# Patient Record
Sex: Female | Born: 1955 | Race: White | Hispanic: No | Marital: Married | Smoking: Never smoker
Health system: Southern US, Community
[De-identification: ages and names within clinical notes are randomized; demographics above are authoritative.]

## PROBLEM LIST (undated history)

## (undated) DIAGNOSIS — M722 Plantar fascial fibromatosis: Secondary | ICD-10-CM

## (undated) DIAGNOSIS — G5600 Carpal tunnel syndrome, unspecified upper limb: Secondary | ICD-10-CM

## (undated) DIAGNOSIS — R112 Nausea with vomiting, unspecified: Secondary | ICD-10-CM

## (undated) DIAGNOSIS — N8501 Benign endometrial hyperplasia: Secondary | ICD-10-CM

## (undated) DIAGNOSIS — Z9889 Other specified postprocedural states: Secondary | ICD-10-CM

## (undated) DIAGNOSIS — I10 Essential (primary) hypertension: Secondary | ICD-10-CM

## (undated) DIAGNOSIS — L920 Granuloma annulare: Secondary | ICD-10-CM

## (undated) DIAGNOSIS — G4733 Obstructive sleep apnea (adult) (pediatric): Secondary | ICD-10-CM

## (undated) DIAGNOSIS — N840 Polyp of corpus uteri: Secondary | ICD-10-CM

## (undated) HISTORY — DX: Essential (primary) hypertension: I10

## (undated) HISTORY — PX: DILATION AND CURETTAGE OF UTERUS: SHX78

## (undated) HISTORY — DX: Plantar fascial fibromatosis: M72.2

## (undated) HISTORY — DX: Granuloma annulare: L92.0

## (undated) HISTORY — DX: Obstructive sleep apnea (adult) (pediatric): G47.33

## (undated) HISTORY — DX: Benign endometrial hyperplasia: N85.01

## (undated) HISTORY — DX: Carpal tunnel syndrome, unspecified upper limb: G56.00

## (undated) HISTORY — PX: TONSILLECTOMY: SUR1361

## (undated) HISTORY — DX: Polyp of corpus uteri: N84.0

---

## 1987-04-28 HISTORY — PX: DILATION AND CURETTAGE, DIAGNOSTIC / THERAPEUTIC: SUR384

## 1991-04-28 HISTORY — PX: TUBAL LIGATION: SHX77

## 2010-10-26 LAB — HM COLONOSCOPY

## 2013-09-25 DIAGNOSIS — M549 Dorsalgia, unspecified: Secondary | ICD-10-CM | POA: Insufficient documentation

## 2014-10-05 ENCOUNTER — Encounter: Payer: Self-pay | Admitting: Gynecologic Oncology

## 2014-10-05 ENCOUNTER — Ambulatory Visit: Payer: BLUE CROSS/BLUE SHIELD | Attending: Gynecologic Oncology | Admitting: Gynecologic Oncology

## 2014-10-05 VITALS — BP 139/89 | Temp 98.6°F | Resp 20 | Ht 64.0 in | Wt 216.7 lb

## 2014-10-05 DIAGNOSIS — Z6837 Body mass index (BMI) 37.0-37.9, adult: Secondary | ICD-10-CM | POA: Diagnosis not present

## 2014-10-05 DIAGNOSIS — Z8041 Family history of malignant neoplasm of ovary: Secondary | ICD-10-CM | POA: Insufficient documentation

## 2014-10-05 DIAGNOSIS — E669 Obesity, unspecified: Secondary | ICD-10-CM

## 2014-10-05 DIAGNOSIS — Z7982 Long term (current) use of aspirin: Secondary | ICD-10-CM | POA: Insufficient documentation

## 2014-10-05 DIAGNOSIS — Z8049 Family history of malignant neoplasm of other genital organs: Secondary | ICD-10-CM | POA: Diagnosis not present

## 2014-10-05 DIAGNOSIS — N8501 Benign endometrial hyperplasia: Secondary | ICD-10-CM | POA: Diagnosis not present

## 2014-10-05 DIAGNOSIS — I10 Essential (primary) hypertension: Secondary | ICD-10-CM | POA: Diagnosis not present

## 2014-10-05 NOTE — Progress Notes (Signed)
New Patient Note: Gyn-Onc   Masha Orbach 59 y.o. female requested a new patient consultation regarding complex endometrial hyperplasia.  CC:  Chief Complaint  Patient presents with  . complex endometrial hyperplasia    New patient    Assessment/Plan:  Ms. Windy Dudek  is a 59 y.o.  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 She also has a family history significant for second-degree relatives with ovarian and endometrial cancer.  I had a 45 minute face-to-face counseling session with Ms. Delone during which time I reviewed her records and discussed with her the underlying etiology and nature of complex endometrial hyperplasia. We discussed that there is a 25-30% risk for occult invasive endometrial cancer that coexist with this diagnosis and can only be confirmed on hysterectomy. We also discussed that there is an approximate 30% risk of progression to invasive endometrial cancer if no treatment is instituted. I discussed options for therapy including, the recommended, hysterectomy (this would need to be a total hysterectomy with inclusion of removal of the cervix and, given her family history, I would recommend BSO), or alternatively she could consider a trial of medical therapy with progesterone's. I discussed that progesterone therapy can be delivered by her either intrauterine device, or oral progesterone. We discussed the side effects, benefits, and disadvantages of these different modes of progesterone. I also discussed that progesterone therapy is associated with an up to a 50% chance of regression of the hyperplasia, however is not curative in half of women. I also discussed that it would not provide [place] with information regarding an occult malignancy that is present. I discussed that we typically reserve medical treatment for hyperplasia with atypia for women who are poor surgical candidates or who desire for fertility preservation. She fits neither of  these profiles.  I recommended the patient that she meet with a genetics counselor to discuss the results of her genetic testing that she had performed several years ago. 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 I'm not aware of the significance of this or how this may plan to her underlying diagnosis. [place] is obese and therefore she certainly has risk factors for developing complex atypical hyperplasia of the endometrium, however given her family history, this presentation may be part of a hereditary cancer syndrome such as Lynch syndrome. I would appreciate the input from a genetics counselor regarding her case, because if she does carry genetic predisposition to gynecologic cancer, it may influence her and my decision making regarding hysterectomy and the completeness of that (such as including BSO).  She was provided with my contact information and will notify me if she is interested in pursuing treatment (surgical or hormonal) with me.  HPI: [place] Sek is a 59 year old woman who underwent hysteroscopy D&C on 06/20/2014 for postmenopausal bleeding. That curetting demonstrated an endometrial polyp showing focal complex hyperplasia with focal mild atypia. She was recommended hysterectomy and BSO. She also has symptoms of mild stress urinary incontinence and her gynecologist, Dr Dema Severin, recommended TVT at the time of surgery.  The patient has a family history significant for a maternal grandmother with advanced ovarian cancer which was diagnosed in her 66s, and maternal cousin who had endometrial cancer (also in advanced stage) diagnosed in her middle of life years. The patient is morbidly obese with a BMI of 37 kg/m. She is parous having had 3 vaginal deliveries. She has not had an amenorrheic period greater than 12 months (in the past several years she's had  a. Approximate every 10 months). Therefore she did not think that she was menopausal. She did have a CT scan a couple of  years ago for back pain which showed small abnormalities inside the uterus. I do not have a copy of this imaging. She also underwent an ultrasound at Dr. Orest Dikes office within the see which showed an abnormality in the endometrial lining. This is what prompted her subsequent D&C.   To being recommended hysterectomy and BSO by Dr. Dema Severin, the patient has been somewhat ambivalent about pursuing this option. She has read many resources regarding hysterectomy. She has some concern regarding complications. She asked many questions regarding what to expect after hysterectomy including prolapse, sexual function, menopause and the desire to leave residual ovarian tissue.   Current Meds:  Outpatient Encounter Prescriptions as of 10/05/2014  Medication Sig  . aspirin 325 MG tablet Take 325 mg by mouth as needed.  Marland Kitchen aspirin 81 MG tablet Take 81 mg by mouth daily.  . chlorhexidine (PERIDEX) 0.12 % solution U UTD  . losartan-hydrochlorothiazide (HYZAAR) 50-12.5 MG per tablet Take 1 tablet by mouth daily.   . Pyridoxine HCl (B-6 PO) Take 1 tablet by mouth daily.   No facility-administered encounter medications on file as of 10/05/2014.    Allergy:  Allergies  Allergen Reactions  . Ibuprofen     Per pt, her blood pressure gets extremely high  . Lisinopril Cough  . Tamiflu [Oseltamivir Phosphate] Nausea And Vomiting    Social Hx:   History   Social History  . Marital Status: Married    Spouse Name: N/A  . Number of Children: N/A  . Years of Education: N/A   Occupational History  . Not on file.   Social History Main Topics  . Smoking status: Never Smoker   . Smokeless tobacco: Never Used  . Alcohol Use: Yes     Comment: socially - very rare  . Drug Use: No  . Sexual Activity: Yes   Other Topics Concern  . Not on file   Social History Narrative  . No narrative on file    Past Surgical Hx:  Past Surgical History  Procedure Laterality Date  . Tonsillectomy    . Tubal ligation  1993   . Dilation and curettage, diagnostic / therapeutic  1989  . Dilation and curettage of uterus  2009, 2016    polyp removal    Past Medical Hx:  Past Medical History  Diagnosis Date  . Hypertension   . Plantar fasciitis   . Carpal tunnel syndrome   . Endometrial polyp   . Sleep apnea, obstructive     Past Gynecological History:  SVD x 3, complex atypical endometrial hyperplasia 2/16  No LMP recorded.  Family Hx:  Family History  Problem Relation Age of Onset  . Prostate cancer Maternal Uncle   . Ovarian cancer Maternal Grandmother   . Prostate cancer Maternal Grandfather   . Lung cancer Maternal Uncle   . Uterine cancer Cousin     Review of Systems:  Constitutional  Feels well,    ENT Normal appearing ears and nares bilaterally Skin/Breast  No rash, sores, jaundice, itching, dryness Cardiovascular  No chest pain, shortness of breath, or edema  Pulmonary  No cough or wheeze.  Gastro Intestinal  No nausea, vomitting, or diarrhoea. No bright red blood per rectum, no abdominal pain, change in bowel movement, or constipation.  Genito Urinary  No frequency, urgency, dysuria, see above Musculo Skeletal  No myalgia, arthralgia,  joint swelling or pain  Neurologic  No weakness, numbness, change in gait,  Psychology  No depression, anxiety, insomnia.   Vitals:  Blood pressure 139/89, temperature 98.6 F (37 C), temperature source Oral, resp. rate 20, height '5\' 4"'  (1.626 m), weight 216 lb 11.2 oz (98.294 kg), SpO2 98 %.  Physical Exam: deferred   Donaciano Eva, MD   10/05/2014, 1:58 PM

## 2014-10-05 NOTE — Patient Instructions (Signed)
We will send a referral for you to see a genetic counselor at the cancer center. You will receive a phone call with this appointment information.  Please call us in the future with any questions or concerns.

## 2014-10-10 ENCOUNTER — Other Ambulatory Visit: Payer: Self-pay | Admitting: *Deleted

## 2014-10-10 DIAGNOSIS — N8501 Benign endometrial hyperplasia: Secondary | ICD-10-CM

## 2014-10-17 ENCOUNTER — Telehealth: Payer: Self-pay | Admitting: *Deleted

## 2014-10-17 NOTE — Telephone Encounter (Signed)
Recieved call from patient stating that she would like to proceed with surgery with Dr. Andrey Farmer. Patient states she would like to wait until August for surgery. Patient given tentative surgery for 12-11-14 and scheduled for pre-op appt with Dr. Andrey Farmer on 11-22-14 at 1:30pm. Instructed patient to call with any questions or concerns prior to this appt.  Dr. Andrey Farmer notified of this.

## 2014-10-24 ENCOUNTER — Telehealth: Payer: Self-pay | Admitting: Genetic Counselor

## 2014-10-24 NOTE — Telephone Encounter (Signed)
Left a message for Meredith Gilbert asking her to please bring a copy of her previous test report with her to her appointment with me on 6/30.

## 2014-10-25 ENCOUNTER — Encounter: Payer: Self-pay | Admitting: Genetic Counselor

## 2014-10-25 ENCOUNTER — Ambulatory Visit (HOSPITAL_BASED_OUTPATIENT_CLINIC_OR_DEPARTMENT_OTHER): Payer: BLUE CROSS/BLUE SHIELD | Admitting: Genetic Counselor

## 2014-10-25 ENCOUNTER — Other Ambulatory Visit: Payer: BLUE CROSS/BLUE SHIELD

## 2014-10-25 DIAGNOSIS — Z8049 Family history of malignant neoplasm of other genital organs: Secondary | ICD-10-CM

## 2014-10-25 DIAGNOSIS — N840 Polyp of corpus uteri: Secondary | ICD-10-CM

## 2014-10-25 DIAGNOSIS — Z8042 Family history of malignant neoplasm of prostate: Secondary | ICD-10-CM

## 2014-10-25 DIAGNOSIS — Z8051 Family history of malignant neoplasm of kidney: Secondary | ICD-10-CM

## 2014-10-25 DIAGNOSIS — Z8041 Family history of malignant neoplasm of ovary: Secondary | ICD-10-CM

## 2014-10-25 DIAGNOSIS — Z801 Family history of malignant neoplasm of trachea, bronchus and lung: Secondary | ICD-10-CM

## 2014-10-25 NOTE — Progress Notes (Signed)
REFERRING PROVIDER: Everitt Amber, MD  PRIMARY PROVIDER:  No primary care provider on file.   PRIMARY REASON FOR VISIT:  1. Endometrial polyp   2. Family history of prostate cancer   3. Family history of ovarian cancer   4. Family history of uterine cancer   5. Family history of kidney cancer   6. Family history of lung cancer      HISTORY OF PRESENT ILLNESS:   Meredith Gilbert, a 59 y.o. female, was seen for a [place] cancer genetics consultation at the request of Dr. Denman George due to a family history of prostate, uterine, ovarian, and other cancers.  Meredith Gilbert also has a personal history of endometrial polyps--one recently which demonstrated complex atypical hyperplasia. Earlier this year, Meredith Gilbert had genetic testing to better elicit her hereditary cancer risks.  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  Meredith Gilbert presents to clinic today to discuss the meaning of those results, the possibility of a hereditary predisposition to cancer, whether or not she needs any further genetic testing, and to further clarify her future cancer risks, as well as potential cancer risks for family members.   Meredith Gilbert is a 59 y.o. female with no personal history of cancer.    CANCER HISTORY:   No history exists.   Hx of endometrial polyps and post menopausal bleeding discovered by dilation and curettage Hx of D and C in 1989, 2009, and 2016   HORMONAL RISK FACTORS:  Menarche was at age 14.  First live birth at age 83.  OCP use for approximately 7 years.  Ovaries intact: yes.  Hysterectomy: no.  Menopausal status: last full 71-monthcycle was about 2 years ago.  HRT use: 0 years. Colonoscopy: yes; not sure what was found, but she was on a 5-year track, and will return in 1-2 years. Mammogram within the last year: yes. Number of breast biopsies: 0. Up to date with pelvic exams:  yes. Any excessive radiation exposure in the past:  no  Past Medical History   Diagnosis Date  . Hypertension   . Plantar fasciitis   . Carpal tunnel syndrome   . Endometrial polyp   . Sleep apnea, obstructive     Past Surgical History  Procedure Laterality Date  . Tonsillectomy    . Tubal ligation  1993  . Dilation and curettage, diagnostic / therapeutic  1989  . Dilation and curettage of uterus  2009, 2016    polyp removal    History   Social History  . Marital Status: Married    Spouse Name: N/A  . Number of Children: N/A  . Years of Education: N/A   Social History Main Topics  . Smoking status: Never Smoker   . Smokeless tobacco: Never Used  . Alcohol Use: Yes     Comment: socially - very rare  . Drug Use: No  . Sexual Activity: Yes   Other Topics Concern  . Not on file   Social History Narrative     FAMILY HISTORY:  We obtained a detailed, 4-generation family history.  Significant diagnoses are listed below: Family History  Problem Relation Age of Onset  . Prostate cancer Maternal Uncle   . Ovarian cancer Maternal Grandmother   . Prostate cancer Maternal Grandfather     dx. 776s . Lung cancer Maternal Uncle     smoker; nicotine gum  . Uterine cancer Cousin 61 . Stroke Mother   .  Heart attack Father   . Other Sister     heavy bleeding and 13 exterior cysts on uterus; required TAH-BSO  . Stroke Paternal Grandmother   . Stroke Paternal Grandfather   . Kidney cancer Paternal Uncle 21    may have metastasized to lungs; smoker  . Autism Other      Meredith Gilbert has three children and three grandchildren, all of whom are cancer-free.  Her full sister, age 49, has a history of bleeding and cysts on her uterus, for which she underwent a TAH-BSO.  Meredith Gilbert also has two full brothers who have not had cancer.  One of these brothers has two sons who have have autism spectrum disorder.  Meredith Gilbert's father passed away at 71 from a heart attack.  One paternal uncle had kidney cancer at the age of 45.  The remainder of the paternal  history is unremarkable for additional cancers or numerous colon polyps.  Meredith Gilbert mother is alive and cancer-free at 32.  Two maternal uncles have had cancer--one died of prostate cancer at the age of 44 and one, a smoker, died of lung cancer at 80.  Meredith Gilbert maternal grandfather was also diagnosed with prostate cancer in his 69s.  Her maternal grandmother had ovarian cancer and passed away at the age of 31.  A maternal first cousin was diagnosed with uterine cancer at 48. There is no known additional maternal family history of cancer.  Patient's maternal ancestors are of [place], [place], [place], and [place] descent, and paternal ancestors are of [place], [place], and [place] descent. There is no reported Ashkenazi Jewish ancestry. There is no known consanguinity.  GENETIC COUNSELING ASSESSMENT: Chalise Pe is a 59 y.o. female with a family history of cancer which somewhat suggestive of a hereditary cancer syndrome and predisposition to cancer.  She was previously tested for changes within 25-genes associated with increased risks for breast, ovarian, colorectal, prostate, endometrial, and other cancers.  Her test results were negative for any pathogenic mutations in those 25 genes, however a variant of uncertain significance (VUS), "[id number].[id number]" was discovered in the ATM gene.  We reviewed the meaning of that result and today's visit and whether or not we would consider any further testing based on her family history of cancer.  DISCUSSION: We reviewed the characteristics, features and inheritance patterns of hereditary cancer syndrome, like Lynch syndrome or hereditary breast and ovarian cancer syndrome. We also discussed genetic testing and Meredith Gilbert's test results.  She was previously tested through [employer].  The test utilized was the 25-gene [facility] panel test.  The [facility] gene panel offered by [employer] includes sequencing and rearrangement  analysis of the following 25 genes: APC, ATM, BARD1, BMPR1A, BRCA1, BRCA2, BRIP1, CHD1, CDK4, CDKN2A, CHEK2, EPCAM (large rearrangement only), MLH1, MSH2, MSH6, MUTYH, NBN, PALB2, PMS2, PTEN, RAD51C, RAD51D, SMAD4, STK11, and TP53.  Following the report of her results, she did not discuss with a genetic counselor what these results meant.  We clarified that this test result is negative.  A VUS means that the lab has not seen that particular change in that gene often enough to feel comfortable classifying it as either positive or negative for causing an increased risk for cancer.  We discussed that that result would eventually be reclassified by the lab, after they have seen that particular change often enough to feel comfortable calling it one way or the other.  In the mean time, we treat these types of results as  negative results.      Since Ms. Mackowiak was tested in January of this year, I checked ClinVar, a genetic variant database, to see if there had been any updates.  ClinVar was still reporting VUS status for this particular change in the ATM gene.  Following, Ms. Pischke's visit, I reached out to [employer] to find out whether they had updated the status of this VUS.  The VUS had been updated, and is now considered a "favor polymorphism", thus the VUS is now likely benign and would not have even been reported on a current test result.  There is a chance that this genetic test missed a mutation in one of these 25 genes, however that chance is small.  Thus, based on this result, then, we would not screen or manage Ms. Cohoon any differently.  Any cancer screening would then be based on the family history of cancer and Ms. Rosita Fire own personal medical history.  She should follow screening guidelines presented by her physicians.  As for her decision regarding whether or not to undergo a TAH-BSO, she should discuss this with Dr. Denman George, as no genetic findings suggest any particular next  steps.  Additionally, no family members would be tested for this result, since this was a VUS that was reclassified to likely benign.  There is no reason to believe that this particular genetic change affects cancer risks.   PLAN: At this time, Ms. Jaster has had the most up-to-date genetic testing, and this test was comprehensive in considering genes related to increased risks for eight different types of cancers.  We discussed that no further genetic testing would be indicated at this time.  If anything else arises in the family history she is welcome to contact us.  Additionally, she is welcome to check back in 1-2 years to see if there is any more updated testing options available.    We still do not have an answer for the family history of cancer.  Most cases of cancer are not genetic.  Ms. Niday's mother and siblings have not had cancer.  It is also possible that there could be some hereditary predisposition to cancer in Ms. Alarie's more distant relatives that her mom did not inherit.  We encouraged Ms. Aguilar to remain in contact with cancer genetics annually so that we can continuously update the family history and inform her of any changes in cancer genetics and testing that may be of benefit for her family. Ms. Bogdanski questions were answered to her satisfaction today. Our contact information was provided should additional questions or concerns arise.  Thank you for the referral and allowing [place] to share in the care of your patient.   Jeanine Luz, MS Genetic Counselor [email or web address] phone: [phone]  The patient was seen for a total of 60 minutes in face-to-face genetic counseling.  This patient was discussed with Drs. Magrinat, Lindi Adie and/or Burr Medico who agrees with the above.    _______________________________________________________________________ For Office Staff:  Number of people involved in session: 1 Was an Intern/ student involved with case: no

## 2014-10-26 DIAGNOSIS — N8501 Benign endometrial hyperplasia: Secondary | ICD-10-CM

## 2014-10-26 HISTORY — DX: Benign endometrial hyperplasia: N85.01

## 2014-11-22 ENCOUNTER — Encounter: Payer: Self-pay | Admitting: Gynecologic Oncology

## 2014-11-22 ENCOUNTER — Ambulatory Visit: Payer: BLUE CROSS/BLUE SHIELD | Attending: Gynecologic Oncology | Admitting: Gynecologic Oncology

## 2014-11-22 VITALS — BP 142/64 | HR 68 | Temp 98.7°F | Resp 18 | Ht 64.0 in | Wt 215.2 lb

## 2014-11-22 DIAGNOSIS — N8501 Benign endometrial hyperplasia: Secondary | ICD-10-CM | POA: Insufficient documentation

## 2014-11-22 NOTE — Patient Instructions (Addendum)
Preparing for your Surgery  Plan for surgery on August 16 with Dr. Andrey Farmer for robotic assisted total hysterectomy, bilateral salpingo-oophorectomy, possible lymphadenectomy.  Pre-operative Testing -You will receive a phone call from presurgical testing at [facility] to arrange for a pre-operative testing appointment before your surgery.  This appointment normally occurs one to two weeks before your scheduled surgery.   -Bring your insurance card, copy of an advanced directive if applicable, medication list  -At that visit, you will be asked to sign a consent for a possible blood transfusion in case a transfusion becomes necessary during surgery.  The need for a blood transfusion is rare but having consent is a necessary part of your care.     - STOP TAKING ASPIRIN NOW  Day Before Surgery at Home -You will be asked to take in only clear liquids the day before surgery.  Examples of clear liquids include broths, jello, and clear juices.  Avoid carbonated beverages.  You may also be advised to perform a Miralax bowel prep or fleets enema the night before your surgery based off of your provider's recommendations.  You will be advised to have nothing to eat or drink after midnight the evening before.    Your role in recovery Your role is to become active as soon as directed by your doctor, while still giving yourself time to heal.  Rest when you feel tired. You will be asked to do the following in order to speed your recovery:  - Cough and breathe deeply. This helps toclear and expand your lungs and can prevent pneumonia. You may be given a spirometer to practice deep breathing. A staff member will show you how to use the spirometer. - Do mild physical activity. Walking or moving your legs help your circulation and body functions return to normal. A staff member will help you when you try to walk and will provide you with simple exercises. Do not try to get up or walk alone the  first time. - Actively manage your pain. Managing your pain lets you move in comfort. We will ask you to rate your pain on a scale of zero to 10. It is your responsibility to tell your doctor or nurse where and how much you hurt so your pain can be treated.  Special Considerations -If you are diabetic, you may be placed on insulin after surgery to have closer control over your blood sugars to promote healing and recovery.  This does not mean that you will be discharged on insulin.  If applicable, your oral antidiabetics will be resumed when you are tolerating a solid diet.  -Your final pathology results from surgery should be available by the Friday after surgery and the results will be relayed to you when available.  Blood Transfusion Information WHAT IS A BLOOD TRANSFUSION? A transfusion is the replacement of blood or some of its parts. Blood is made up of multiple cells which provide different functions.  Red blood cells carry oxygen and are used for blood loss replacement.  White blood cells fight against infection.  Platelets control bleeding.  Plasma helps clot blood.  Other blood products are available for specialized needs, such as hemophilia or other clotting disorders. BEFORE THE TRANSFUSION  Who gives blood for transfusions?   You may be able to donate blood to be used at a later date on yourself (autologous donation).  Relatives can be asked to donate blood. This is generally not any safer than if you have received blood  from a stranger. The same precautions are taken to ensure safety when a relative's blood is donated.  Healthy volunteers who are fully evaluated to make sure their blood is safe. This is blood bank blood. Transfusion therapy is the safest it has ever been in the practice of medicine. Before blood is taken from a donor, a complete history is taken to make sure that person has no history of diseases nor engages in risky social behavior (examples are intravenous  drug use or sexual activity with multiple partners). The donor's travel history is screened to minimize risk of transmitting infections, such as malaria. The donated blood is tested for signs of infectious diseases, such as HIV and hepatitis. The blood is then tested to be sure it is compatible with you in order to minimize the chance of a transfusion reaction. If you or a relative donates blood, this is often done in anticipation of surgery and is not appropriate for emergency situations. It takes many days to process the donated blood. RISKS AND COMPLICATIONS Although transfusion therapy is very safe and saves many lives, the main dangers of transfusion include:   Getting an infectious disease.  Developing a transfusion reaction. This is an allergic reaction to something in the blood you were given. Every precaution is taken to prevent this. The decision to have a blood transfusion has been considered carefully by your caregiver before blood is given. Blood is not given unless the benefits outweigh the risks.

## 2014-11-22 NOTE — Progress Notes (Signed)
New Patient Note: Gyn-Onc   Meredith Gilbert 59 y.o. female requested a new patient consultation regarding complex endometrial hyperplasia.  CC:  Chief Complaint  Patient presents with  . Complex endometrial hyperplasia    pre-op    Assessment/Plan:  [name]  is a 59 y.o.  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 She also has a family history significant for second-degree relatives with ovarian and endometrial cancer.  No BRCA or Lynch concerns based on genetic counseling.  Plan is for robotic assisted TLH, BSO. I discussed the surgical procedure and surgical risks including  bleeding, infection, damage to internal organs (such as bladder,ureters, bowels), blood clot, reoperation and rehospitalization. I discussed anticipated overnight length of stay, and anticipated postoperative recovery. We discussed potential symptoms of hormone withdrawal and consideration of Estraderm replacement of these develop.  HPI: Meredith Gilbert is a 59 year old woman who underwent hysteroscopy D&C on 06/20/2014 for postmenopausal bleeding. That curetting demonstrated an endometrial polyp showing focal complex hyperplasia with focal mild atypia. She was recommended hysterectomy and BSO. She also has symptoms of mild stress urinary incontinence and her gynecologist, Dr Dema Severin, recommended TVT at the time of surgery.  The patient has a family history significant for a maternal grandmother with advanced ovarian cancer which was diagnosed in her 97s, and maternal cousin who had endometrial cancer (also in advanced stage) diagnosed in her middle of life years. The patient is morbidly obese with a BMI of 37 kg/m. She is parous having had 3 vaginal deliveries. She has not had an amenorrheic period greater than 12 months (in the past several years she's had a. Approximate every 10 months). Therefore she did not think that she was menopausal. She did have a CT scan a couple of  years ago for back pain which showed small abnormalities inside the uterus. I do not have a copy of this imaging. She also underwent an ultrasound at Dr. Orest Dikes office within the see which showed an abnormality in the endometrial lining. This is what prompted her subsequent D&C.   To being recommended hysterectomy and BSO by Dr. Dema Severin, the patient has been somewhat ambivalent about pursuing this option. She has read many resources regarding hysterectomy. She has some concern regarding complications. She asked many questions regarding what to expect after hysterectomy including prolapse, sexual function, menopause and the desire to leave residual ovarian tissue.   Current Meds:  Outpatient Encounter Prescriptions as of 11/22/2014  Medication Sig  . aspirin 325 MG tablet Take 325 mg by mouth as needed.  Marland Kitchen aspirin 81 MG tablet Take 81 mg by mouth daily.  . chlorhexidine (PERIDEX) 0.12 % solution U UTD  . Cholecalciferol (VITAMIN D-3 PO) Take 1 capsule by mouth daily.  Marland Kitchen losartan-hydrochlorothiazide (HYZAAR) 50-12.5 MG per tablet Take 1 tablet by mouth daily.   . Probiotic Product (PROBIOTIC DAILY PO) Take 1 capsule by mouth daily.  . Pyridoxine HCl (B-6 PO) Take 1 tablet by mouth daily.  Marland Kitchen VITAMIN K PO Take 1 capsule by mouth daily.   No facility-administered encounter medications on file as of 11/22/2014.    Allergy:  Allergies  Allergen Reactions  . Ibuprofen     Per pt, her blood pressure gets extremely high  . Lisinopril Cough  . Tamiflu [Oseltamivir Phosphate] Nausea And Vomiting    Social Hx:   History   Social History  . Marital Status: Married    Spouse Name: N/A  . Number of Children: N/A  . Years  of Education: N/A   Occupational History  . Not on file.   Social History Main Topics  . Smoking status: Never Smoker   . Smokeless tobacco: Never Used  . Alcohol Use: Yes     Comment: socially - very rare  . Drug Use: No  . Sexual Activity: Yes   Other Topics Concern   . Not on file   Social History Narrative    Past Surgical Hx:  Past Surgical History  Procedure Laterality Date  . Tonsillectomy    . Tubal ligation  1993  . Dilation and curettage, diagnostic / therapeutic  1989  . Dilation and curettage of uterus  2009, 2016    polyp removal    Past Medical Hx:  Past Medical History  Diagnosis Date  . Hypertension   . Plantar fasciitis   . Carpal tunnel syndrome   . Endometrial polyp   . Sleep apnea, obstructive     Past Gynecological History:  SVD x 3, complex atypical endometrial hyperplasia 2/16  No LMP recorded.  Family Hx:  Family History  Problem Relation Age of Onset  . Prostate cancer Maternal Uncle   . Ovarian cancer Maternal Grandmother   . Prostate cancer Maternal Grandfather     dx. 55s  . Lung cancer Maternal Uncle     smoker; nicotine gum  . Uterine cancer Cousin 35  . Stroke Mother   . Heart attack Father   . Other Sister     heavy bleeding and 13 exterior cysts on uterus; required TAH-BSO  . Stroke Paternal Grandmother   . Stroke Paternal Grandfather   . Kidney cancer Paternal Uncle 38    may have metastasized to lungs; smoker  . Autism Other     Review of Systems:  Constitutional  Feels well,    ENT Normal appearing ears and nares bilaterally Skin/Breast  No rash, sores, jaundice, itching, dryness Cardiovascular  No chest pain, shortness of breath, or edema  Pulmonary  No cough or wheeze.  Gastro Intestinal  No nausea, vomitting, or diarrhoea. No bright red blood per rectum, no abdominal pain, change in bowel movement, or constipation.  Genito Urinary  No frequency, urgency, dysuria, see above Musculo Skeletal  No myalgia, arthralgia, joint swelling or pain  Neurologic  No weakness, numbness, change in gait,  Psychology  No depression, anxiety, insomnia.   Vitals:  Blood pressure 142/64, pulse 68, temperature 98.7 F (37.1 C), temperature source Oral, resp. rate 18, height '5\' 4"'  (1.626 m),  weight 215 lb 3.2 oz (97.614 kg), SpO2 100 %.  Physical Exam: General: in no distress Skin: no lesiosn Pulm: CTAB CVS: RRR Abdo: obese, soft, nontender, no masses Gyn: normal external female genitalia, normal cervix, normal small mobile uterus. Rectal: deferred   Donaciano Eva, MD   11/22/2014, 3:55 PM

## 2014-12-06 NOTE — Patient Instructions (Addendum)
22 Meredith Gilbert  12/06/2014   Your procedure is scheduled on:   12-11-2014 Tuesday  Enter through [facility]  Entrance and follow signs to [facility]. Arrive at      0530  AM ..  (Limit 1 person with you).  Call this number if you have problems the morning of surgery: [phone]  Or Presurgical Testing [phone].   For Living Will and/or Health Care Power Attorney Forms: please provide copy for your medical record,may bring AM of surgery(Forms should be already notarized -we do not provide this service).(Yes, documents provided and placed today with medical records).  Remember: Follow any bowel prep instructions per MD office.(Clear Liquids x 24 hours prior) For Cpap use: Bring mask and tubing only.   Do not eat food/ or drink: After Midnight.    Clear liquids include soda, tea, black coffee, apple or grape juice, broth.  Take these medicines the morning of surgery with A SIP OF WATER: Loratadine, Tylenol (if need).   Do not wear jewelry, make-up or nail polish.  Do not wear deodorant, lotions, powders, or perfumes.   Do not shave legs and under arms- 48 hours(2 days) prior to first CHG shower.(Shaving face and neck okay.)  Do not bring valuables to the hospital.(Hospital is not responsible for lost valuables).  Contacts, dentures or removable bridgework, body piercing, hair pins may not be worn into surgery.  Leave suitcase in the car. After surgery it may be brought to your room.  For patients admitted to the hospital, checkout time is 11:00 AM the day of discharge.(Restricted visitors-Any Persons displaying flu-like symptoms or illness).    Patients discharged the day of surgery will not be allowed to drive home. Must have responsible person with you x 24 hours once discharged.  Name and phone number of your driver:Meredith Gilbert- spouse [phone] cell      Please read over the following fact sheets that you were given:  CHG(Chlorhexidine Gluconate 4% Surgical Soap)  use, Blood Transfusion fact sheet, Incentive Spirometry Instruction.  Remember : Type/Screen "Blue armbands" - may not be removed once applied(would result in being retested AM of surgery, if removed).         [place] - Preparing for Surgery Before surgery, you can play an important role.  Because skin is not sterile, your skin needs to be as free of germs as possible.  You can reduce the number of germs on your skin by washing with CHG (chlorahexidine gluconate) soap before surgery.  CHG is an antiseptic cleaner which kills germs and bonds with the skin to continue killing germs even after washing. Please DO NOT use if you have an allergy to CHG or antibacterial soaps.  If your skin becomes reddened/irritated stop using the CHG and inform your nurse when you arrive at Short Stay. Do not shave (including legs and underarms) for at least 48 hours prior to the first CHG shower.  You may shave your face/neck. Please follow these instructions carefully:  1.  Shower with CHG Soap the night before surgery and the  morning of Surgery.  2.  If you choose to wash your hair, wash your hair first as usual with your  normal  shampoo.  3.  After you shampoo, rinse your hair and body thoroughly to remove the  shampoo.                           4.  Use CHG as  you would any other liquid soap.  You can apply chg directly  to the skin and wash                       Gently with a scrungie or clean washcloth.  5.  Apply the CHG Soap to your body ONLY FROM THE NECK DOWN.   Do not use on face/ open                           Wound or open sores. Avoid contact with eyes, ears mouth and genitals (private parts).                       Wash face,  Genitals (private parts) with your normal soap.             6.  Wash thoroughly, paying special attention to the area where your surgery  will be performed.  7.  Thoroughly rinse your body with warm water from the neck down.  8.  DO NOT shower/wash with your normal soap  after using and rinsing off  the CHG Soap.                9.  Pat yourself dry with a clean towel.            10.  Wear clean pajamas.            11.  Place clean sheets on your bed the night of your first shower and do not  sleep with pets. Day of Surgery : Do not apply any lotions/deodorants the morning of surgery.  Please wear clean clothes to the hospital/surgery center.  FAILURE TO FOLLOW THESE INSTRUCTIONS MAY RESULT IN THE CANCELLATION OF YOUR SURGERY PATIENT SIGNATURE_________________________________  NURSE SIGNATURE__________________________________  ________________________________________________________________________   [name]  An incentive spirometer is a tool that can help keep your lungs clear and active. This tool measures how well you are filling your lungs with each breath. Taking long deep breaths may help reverse or decrease the chance of developing breathing (pulmonary) problems (especially infection) following:  A long period of time when you are unable to move or be active. BEFORE THE PROCEDURE   If the spirometer includes an indicator to show your best effort, your nurse or respiratory therapist will set it to a desired goal.  If possible, sit up straight or lean slightly forward. Try not to slouch.  Hold the incentive spirometer in an upright position. INSTRUCTIONS FOR USE   Sit on the edge of your bed if possible, or sit up as far as you can in bed or on a chair.  Hold the incentive spirometer in an upright position.  Breathe out normally.  Place the mouthpiece in your mouth and seal your lips tightly around it.  Breathe in slowly and as deeply as possible, raising the piston or the ball toward the top of the column.  Hold your breath for 3-5 seconds or for as long as possible. Allow the piston or ball to fall to the bottom of the column.  Remove the mouthpiece from your mouth and breathe out normally.  Rest for a few seconds and  repeat Steps 1 through 7 at least 10 times every 1-2 hours when you are awake. Take your time and take a few normal breaths between deep breaths.  The spirometer may include an indicator to show your best effort. Use  the indicator as a goal to work toward during each repetition.  After each set of 10 deep breaths, practice coughing to be sure your lungs are clear. If you have an incision (the cut made at the time of surgery), support your incision when coughing by placing a pillow or rolled up towels firmly against it. Once you are able to get out of bed, walk around indoors and cough well. You may stop using the incentive spirometer when instructed by your caregiver.  RISKS AND COMPLICATIONS  Take your time so you do not get dizzy or light-headed.  If you are in pain, you may need to take or ask for pain medication before doing incentive spirometry. It is harder to take a deep breath if you are having pain. AFTER USE  Rest and breathe slowly and easily.  It can be helpful to keep track of a log of your progress. Your caregiver can provide you with a simple table to help with this. If you are using the spirometer at home, follow these instructions: SEEK MEDICAL CARE IF:   You are having difficultly using the spirometer.  You have trouble using the spirometer as often as instructed.  Your pain medication is not giving enough relief while using the spirometer.  You develop fever of 100.5 F (38.1 C) or higher. SEEK IMMEDIATE MEDICAL CARE IF:   You cough up bloody sputum that had not been present before.  You develop fever of 102 F (38.9 C) or greater.  You develop worsening pain at or near the incision site. MAKE SURE YOU:   Understand these instructions.  Will watch your condition.  Will get help right away if you are not doing well or get worse. Document Released: 08/24/2006 Document Revised: 07/06/2011 Document Reviewed: 10/25/2006 Exit[employer] Patient Information 2014  [employer], [place].   ________________________________________________________________________  WHAT IS A BLOOD TRANSFUSION? Blood Transfusion Information  A transfusion is the replacement of blood or some of its parts. Blood is made up of multiple cells which provide different functions.  Red blood cells carry oxygen and are used for blood loss replacement.  White blood cells fight against infection.  Platelets control bleeding.  Plasma helps clot blood.  Other blood products are available for specialized needs, such as hemophilia or other clotting disorders. BEFORE THE TRANSFUSION  Who gives blood for transfusions?   Healthy volunteers who are fully evaluated to make sure their blood is safe. This is blood bank blood. Transfusion therapy is the safest it has ever been in the practice of medicine. Before blood is taken from a donor, a complete history is taken to make sure that person has no history of diseases nor engages in risky social behavior (examples are intravenous drug use or sexual activity with multiple partners). The donor's travel history is screened to minimize risk of transmitting infections, such as malaria. The donated blood is tested for signs of infectious diseases, such as HIV and hepatitis. The blood is then tested to be sure it is compatible with you in order to minimize the chance of a transfusion reaction. If you or a relative donates blood, this is often done in anticipation of surgery and is not appropriate for emergency situations. It takes many days to process the donated blood. RISKS AND COMPLICATIONS Although transfusion therapy is very safe and saves many lives, the main dangers of transfusion include:   Getting an infectious disease.  Developing a transfusion reaction. This is an allergic reaction to something in the blood you  were given. Every precaution is taken to prevent this. The decision to have a blood transfusion has been considered carefully by your  caregiver before blood is given. Blood is not given unless the benefits outweigh the risks. AFTER THE TRANSFUSION  Right after receiving a blood transfusion, you will usually feel much better and more energetic. This is especially true if your red blood cells have gotten low (anemic). The transfusion raises the level of the red blood cells which carry oxygen, and this usually causes an energy increase.  The nurse administering the transfusion will monitor you carefully for complications. HOME CARE INSTRUCTIONS  No special instructions are needed after a transfusion. You may find your energy is better. Speak with your caregiver about any limitations on activity for underlying diseases you may have. SEEK MEDICAL CARE IF:   Your condition is not improving after your transfusion.  You develop redness or irritation at the intravenous (IV) site. SEEK IMMEDIATE MEDICAL CARE IF:  Any of the following symptoms occur over the next 12 hours:  Shaking chills.  You have a temperature by mouth above 102 F (38.9 C), not controlled by medicine.  Chest, back, or muscle pain.  People around you feel you are not acting correctly or are confused.  Shortness of breath or difficulty breathing.  Dizziness and fainting.  You get a rash or develop hives.  You have a decrease in urine output.  Your urine turns a dark color or changes to pink, red, or brown. Any of the following symptoms occur over the next 10 days:  You have a temperature by mouth above 102 F (38.9 C), not controlled by medicine.  Shortness of breath.  Weakness after normal activity.  The white part of the eye turns yellow (jaundice).  You have a decrease in the amount of urine or are urinating less often.  Your urine turns a dark color or changes to pink, red, or brown. Document Released: 04/10/2000 Document Revised: 07/06/2011 Document Reviewed: 11/28/2007 [facility] Patient Information 2014 [place],  [place].  _______________________________________________________________________

## 2014-12-07 ENCOUNTER — Encounter (HOSPITAL_COMMUNITY)
Admission: RE | Admit: 2014-12-07 | Discharge: 2014-12-07 | Disposition: A | Discharge status: Home / Self Care | Payer: BLUE CROSS/BLUE SHIELD | Source: Ambulatory Visit | Attending: Gynecologic Oncology | Admitting: Gynecologic Oncology

## 2014-12-07 ENCOUNTER — Encounter (HOSPITAL_COMMUNITY): Payer: Self-pay

## 2014-12-07 DIAGNOSIS — Z0183 Encounter for blood typing: Secondary | ICD-10-CM | POA: Diagnosis not present

## 2014-12-07 DIAGNOSIS — I10 Essential (primary) hypertension: Secondary | ICD-10-CM | POA: Diagnosis not present

## 2014-12-07 DIAGNOSIS — Z01812 Encounter for preprocedural laboratory examination: Secondary | ICD-10-CM | POA: Insufficient documentation

## 2014-12-07 DIAGNOSIS — N8501 Benign endometrial hyperplasia: Secondary | ICD-10-CM | POA: Diagnosis not present

## 2014-12-07 DIAGNOSIS — Z01810 Encounter for preprocedural cardiovascular examination: Secondary | ICD-10-CM | POA: Diagnosis not present

## 2014-12-07 HISTORY — DX: Nausea with vomiting, unspecified: R11.2

## 2014-12-07 HISTORY — DX: Other specified postprocedural states: Z98.890

## 2014-12-07 LAB — COMPREHENSIVE METABOLIC PANEL
ALBUMIN: 4.1 g/dL (ref 3.5–5.0)
ALT: 34 U/L (ref 14–54)
ANION GAP: 8 (ref 5–15)
AST: 27 U/L (ref 15–41)
Alkaline Phosphatase: 65 U/L (ref 38–126)
BUN: 17 mg/dL (ref 6–20)
CHLORIDE: 105 mmol/L (ref 101–111)
CO2: 27 mmol/L (ref 22–32)
Calcium: 9.4 mg/dL (ref 8.9–10.3)
Creatinine, Ser: 0.82 mg/dL (ref 0.44–1.00)
GFR calc Af Amer: >60 mL/min (ref >60)
GFR calc non Af Amer: >60 mL/min (ref >60)
Glucose, Bld: 99 mg/dL (ref 65–99)
Potassium: 4 mmol/L (ref 3.5–5.1)
Sodium: 140 mmol/L (ref 135–145)
TOTAL PROTEIN: 7.6 g/dL (ref 6.5–8.1)
Total Bilirubin: 0.3 mg/dL (ref 0.3–1.2)

## 2014-12-07 LAB — CBC WITH DIFFERENTIAL/PLATELET
BASOS ABS: 0 10*3/uL (ref 0.0–0.1)
Basophils Relative: 0 % (ref 0–1)
EOS PCT: 4 % (ref 0–5)
Eosinophils Absolute: 0.3 10*3/uL (ref 0.0–0.7)
HCT: 40.5 % (ref 36.0–46.0)
Hemoglobin: 13.4 g/dL (ref 12.0–15.0)
LYMPHS ABS: 2.7 10*3/uL (ref 0.7–4.0)
LYMPHS PCT: 36 % (ref 12–46)
MCH: 28.3 pg (ref 26.0–34.0)
MCHC: 33.1 g/dL (ref 30.0–36.0)
MCV: 85.6 fL (ref 78.0–100.0)
Monocytes Absolute: 0.6 10*3/uL (ref 0.1–1.0)
Monocytes Relative: 8 % (ref 3–12)
Neutro Abs: 3.9 10*3/uL (ref 1.7–7.7)
Neutrophils Relative %: 52 % (ref 43–77)
Platelets: 341 10*3/uL (ref 150–400)
RBC: 4.73 MIL/uL (ref 3.87–5.11)
RDW: 13.1 % (ref 11.5–15.5)
WBC: 7.5 10*3/uL (ref 4.0–10.5)

## 2014-12-07 LAB — URINALYSIS, ROUTINE W REFLEX MICROSCOPIC
Bilirubin Urine: NEGATIVE
GLUCOSE, UA: NEGATIVE mg/dL
Hgb urine dipstick: NEGATIVE
KETONES UR: NEGATIVE mg/dL
Leukocytes, UA: NEGATIVE
Nitrite: NEGATIVE
PH: 5.5 (ref 5.0–8.0)
Protein, ur: NEGATIVE mg/dL
Specific Gravity, Urine: 1.027 (ref 1.005–1.030)
Urobilinogen, UA: 0.2 mg/dL (ref 0.0–1.0)

## 2014-12-07 LAB — ABO/RH: ABO/RH(D): A POS

## 2014-12-07 NOTE — Pre-Procedure Instructions (Signed)
EKG done today.

## 2014-12-11 ENCOUNTER — Ambulatory Visit (HOSPITAL_COMMUNITY): Payer: BLUE CROSS/BLUE SHIELD | Admitting: Certified Registered Nurse Anesthetist

## 2014-12-11 ENCOUNTER — Encounter (HOSPITAL_COMMUNITY): Payer: Self-pay | Admitting: Anesthesiology

## 2014-12-11 ENCOUNTER — Encounter (HOSPITAL_COMMUNITY)
Admission: RE | Disposition: A | Discharge status: Home / Self Care | Payer: Self-pay | Source: Ambulatory Visit | Attending: Gynecologic Oncology

## 2014-12-11 ENCOUNTER — Ambulatory Visit (HOSPITAL_COMMUNITY)
Admission: RE | Admit: 2014-12-11 | Discharge: 2014-12-12 | Disposition: A | Discharge status: Home / Self Care | Payer: BLUE CROSS/BLUE SHIELD | Source: Ambulatory Visit | Attending: Gynecologic Oncology | Admitting: Gynecologic Oncology

## 2014-12-11 DIAGNOSIS — Z7982 Long term (current) use of aspirin: Secondary | ICD-10-CM | POA: Insufficient documentation

## 2014-12-11 DIAGNOSIS — Z6837 Body mass index (BMI) 37.0-37.9, adult: Secondary | ICD-10-CM | POA: Insufficient documentation

## 2014-12-11 DIAGNOSIS — G4733 Obstructive sleep apnea (adult) (pediatric): Secondary | ICD-10-CM | POA: Insufficient documentation

## 2014-12-11 DIAGNOSIS — Z8049 Family history of malignant neoplasm of other genital organs: Secondary | ICD-10-CM | POA: Diagnosis not present

## 2014-12-11 DIAGNOSIS — I10 Essential (primary) hypertension: Secondary | ICD-10-CM | POA: Diagnosis not present

## 2014-12-11 DIAGNOSIS — G709 Myoneural disorder, unspecified: Secondary | ICD-10-CM | POA: Insufficient documentation

## 2014-12-11 DIAGNOSIS — Z79899 Other long term (current) drug therapy: Secondary | ICD-10-CM | POA: Insufficient documentation

## 2014-12-11 DIAGNOSIS — N8500 Endometrial hyperplasia, unspecified: Secondary | ICD-10-CM | POA: Diagnosis present

## 2014-12-11 DIAGNOSIS — N8502 Endometrial intraepithelial neoplasia [EIN]: Secondary | ICD-10-CM

## 2014-12-11 DIAGNOSIS — N8501 Benign endometrial hyperplasia: Secondary | ICD-10-CM | POA: Insufficient documentation

## 2014-12-11 DIAGNOSIS — N393 Stress incontinence (female) (male): Secondary | ICD-10-CM | POA: Insufficient documentation

## 2014-12-11 HISTORY — PX: ROBOTIC ASSISTED TOTAL HYSTERECTOMY WITH BILATERAL SALPINGO OOPHERECTOMY: SHX6086

## 2014-12-11 LAB — TYPE AND SCREEN
ABO/RH(D): A POS
Antibody Screen: NEGATIVE

## 2014-12-11 SURGERY — ROBOTIC ASSISTED TOTAL HYSTERECTOMY WITH BILATERAL SALPINGO OOPHORECTOMY
Anesthesia: General | Laterality: Bilateral

## 2014-12-11 MED ORDER — ONDANSETRON HCL 4 MG/2ML IJ SOLN
INTRAMUSCULAR | Status: AC
  Filled 2014-12-11: qty 2

## 2014-12-11 MED ORDER — LIDOCAINE HCL (CARDIAC) 20 MG/ML IV SOLN
INTRAVENOUS | Status: DC | PRN
  Administered 2014-12-11: 100 mg via INTRAVENOUS

## 2014-12-11 MED ORDER — EPHEDRINE SULFATE 50 MG/ML IJ SOLN
INTRAMUSCULAR | Status: DC | PRN
  Administered 2014-12-11: 10 mg via INTRAVENOUS
  Administered 2014-12-11 (×2): 5 mg via INTRAVENOUS

## 2014-12-11 MED ORDER — NEOSTIGMINE METHYLSULFATE 10 MG/10ML IV SOLN
INTRAVENOUS | Status: AC
  Filled 2014-12-11: qty 1

## 2014-12-11 MED ORDER — DEXAMETHASONE SODIUM PHOSPHATE 10 MG/ML IJ SOLN
INTRAMUSCULAR | Status: DC | PRN
  Administered 2014-12-11: 10 mg via INTRAVENOUS

## 2014-12-11 MED ORDER — FENTANYL CITRATE (PF) 100 MCG/2ML IJ SOLN
INTRAMUSCULAR | Status: DC | PRN
  Administered 2014-12-11 (×5): 50 ug via INTRAVENOUS

## 2014-12-11 MED ORDER — LIDOCAINE HCL (CARDIAC) 20 MG/ML IV SOLN
INTRAVENOUS | Status: AC
  Filled 2014-12-11: qty 5

## 2014-12-11 MED ORDER — PROPOFOL 10 MG/ML IV BOLUS
INTRAVENOUS | Status: DC | PRN
  Administered 2014-12-11: 160 mg via INTRAVENOUS

## 2014-12-11 MED ORDER — MIDAZOLAM HCL 5 MG/5ML IJ SOLN
INTRAMUSCULAR | Status: DC | PRN
Start: 2014-12-11 — End: 2014-12-11
  Administered 2014-12-11: 2 mg via INTRAVENOUS

## 2014-12-11 MED ORDER — PROPOFOL 10 MG/ML IV BOLUS
INTRAVENOUS | Status: AC
  Filled 2014-12-11: qty 20

## 2014-12-11 MED ORDER — HYDROMORPHONE HCL 1 MG/ML IJ SOLN
INTRAMUSCULAR | Status: DC | PRN
  Administered 2014-12-11 (×2): 0.5 mg via INTRAVENOUS

## 2014-12-11 MED ORDER — GLYCOPYRROLATE 0.2 MG/ML IJ SOLN
INTRAMUSCULAR | Status: DC | PRN
  Administered 2014-12-11: 0.6 mg via INTRAVENOUS

## 2014-12-11 MED ORDER — HYDROMORPHONE HCL 1 MG/ML IJ SOLN
INTRAMUSCULAR | Status: AC
  Filled 2014-12-11: qty 1

## 2014-12-11 MED ORDER — HYDROCHLOROTHIAZIDE 12.5 MG PO CAPS
12.5000 mg | ORAL_CAPSULE | Freq: Every day | ORAL | Status: DC
  Administered 2014-12-11: 12.5 mg via ORAL
  Filled 2014-12-11 (×3): qty 1

## 2014-12-11 MED ORDER — LOSARTAN POTASSIUM 50 MG PO TABS
50.0000 mg | ORAL_TABLET | Freq: Every day | ORAL | Status: DC
  Administered 2014-12-11: 50 mg via ORAL
  Filled 2014-12-11 (×3): qty 1

## 2014-12-11 MED ORDER — ONDANSETRON HCL 4 MG/2ML IJ SOLN
4.0000 mg | Freq: Four times a day (QID) | INTRAMUSCULAR | Status: DC | PRN
Start: 2014-12-11 — End: 2014-12-12

## 2014-12-11 MED ORDER — ESMOLOL HCL 10 MG/ML IV SOLN
INTRAVENOUS | Status: AC
  Filled 2014-12-11: qty 10

## 2014-12-11 MED ORDER — PROMETHAZINE HCL 25 MG/ML IJ SOLN: 6.2500 mg | INTRAMUSCULAR | Status: DC | PRN

## 2014-12-11 MED ORDER — NEOSTIGMINE METHYLSULFATE 10 MG/10ML IV SOLN
INTRAVENOUS | Status: DC | PRN
  Administered 2014-12-11: 4 mg via INTRAVENOUS

## 2014-12-11 MED ORDER — CEFAZOLIN SODIUM-DEXTROSE 2-3 GM-% IV SOLR
INTRAVENOUS | Status: AC
  Filled 2014-12-11: qty 50

## 2014-12-11 MED ORDER — ONDANSETRON HCL 4 MG/2ML IJ SOLN
INTRAMUSCULAR | Status: DC | PRN
  Administered 2014-12-11: 4 mg via INTRAVENOUS

## 2014-12-11 MED ORDER — HYDROMORPHONE HCL 1 MG/ML IJ SOLN
0.2000 mg | INTRAMUSCULAR | Status: DC | PRN
  Administered 2014-12-11: 0.5 mg via INTRAVENOUS
  Filled 2014-12-11 (×2): qty 1

## 2014-12-11 MED ORDER — MIDAZOLAM HCL 2 MG/2ML IJ SOLN
INTRAMUSCULAR | Status: AC
  Filled 2014-12-11: qty 4

## 2014-12-11 MED ORDER — ROCURONIUM BROMIDE 100 MG/10ML IV SOLN
INTRAVENOUS | Status: DC | PRN
  Administered 2014-12-11 (×3): 10 mg via INTRAVENOUS
  Administered 2014-12-11: 40 mg via INTRAVENOUS

## 2014-12-11 MED ORDER — LOSARTAN POTASSIUM-HCTZ 50-12.5 MG PO TABS: 1.0000 | ORAL_TABLET | Freq: Every day | ORAL | Status: DC

## 2014-12-11 MED ORDER — HYDROMORPHONE HCL 2 MG/ML IJ SOLN
INTRAMUSCULAR | Status: AC
  Filled 2014-12-11: qty 1

## 2014-12-11 MED ORDER — CEFAZOLIN SODIUM-DEXTROSE 2-3 GM-% IV SOLR
2.0000 g | INTRAVENOUS | Status: AC
  Administered 2014-12-11: 2 g via INTRAVENOUS

## 2014-12-11 MED ORDER — TRAMADOL HCL 50 MG PO TABS: 100.0000 mg | ORAL_TABLET | Freq: Four times a day (QID) | ORAL | Status: DC | PRN

## 2014-12-11 MED ORDER — STERILE WATER FOR IRRIGATION IR SOLN
Status: DC | PRN
  Administered 2014-12-11: 1000 mL

## 2014-12-11 MED ORDER — FENTANYL CITRATE (PF) 250 MCG/5ML IJ SOLN
INTRAMUSCULAR | Status: AC
  Filled 2014-12-11: qty 25

## 2014-12-11 MED ORDER — HYDROMORPHONE HCL 1 MG/ML IJ SOLN
0.2500 mg | INTRAMUSCULAR | Status: DC | PRN
  Administered 2014-12-11: 0.5 mg via INTRAVENOUS

## 2014-12-11 MED ORDER — ONDANSETRON HCL 4 MG PO TABS: 4.0000 mg | ORAL_TABLET | Freq: Four times a day (QID) | ORAL | Status: DC | PRN

## 2014-12-11 MED ORDER — OXYCODONE-ACETAMINOPHEN 5-325 MG PO TABS
1.0000 | ORAL_TABLET | ORAL | Status: DC | PRN
  Administered 2014-12-11 (×2): 2 via ORAL
  Administered 2014-12-12: 1 via ORAL
  Filled 2014-12-11: qty 1
  Filled 2014-12-11 (×2): qty 2

## 2014-12-11 MED ORDER — GLYCOPYRROLATE 0.2 MG/ML IJ SOLN
INTRAMUSCULAR | Status: AC
  Filled 2014-12-11: qty 3

## 2014-12-11 MED ORDER — ESMOLOL HCL 10 MG/ML IV SOLN
INTRAVENOUS | Status: DC | PRN
  Administered 2014-12-11: 20 mg via INTRAVENOUS

## 2014-12-11 MED ORDER — LACTATED RINGERS IV SOLN
INTRAVENOUS | Status: DC
  Administered 2014-12-11 (×2): via INTRAVENOUS
  Administered 2014-12-11: 1000 mL via INTRAVENOUS

## 2014-12-11 MED ORDER — SUCCINYLCHOLINE CHLORIDE 20 MG/ML IJ SOLN
INTRAMUSCULAR | Status: DC | PRN
  Administered 2014-12-11: 100 mg via INTRAVENOUS

## 2014-12-11 MED ORDER — LACTATED RINGERS IR SOLN
Status: DC | PRN
  Administered 2014-12-11: 1000 mL

## 2014-12-11 MED ORDER — KCL IN DEXTROSE-NACL 20-5-0.45 MEQ/L-%-% IV SOLN
INTRAVENOUS | Status: DC
  Administered 2014-12-11 – 2014-12-12 (×2): via INTRAVENOUS
  Filled 2014-12-11 (×2): qty 1000

## 2014-12-11 MED ORDER — ROCURONIUM BROMIDE 100 MG/10ML IV SOLN
INTRAVENOUS | Status: AC
  Filled 2014-12-11: qty 1

## 2014-12-11 SURGICAL SUPPLY — 54 items
CABLE HIGH FREQUENCY MONO STRZ (ELECTRODE) ×2 IMPLANT
CATH FOLEY 2WAY SLVR  5CC 16FR (CATHETERS) ×1
CATH FOLEY 2WAY SLVR 5CC 16FR (CATHETERS) ×1 IMPLANT
CHLORAPREP W/TINT 26ML (MISCELLANEOUS) ×2 IMPLANT
CORDS BIPOLAR (ELECTRODE) ×4 IMPLANT
COVER SURGICAL LIGHT HANDLE (MISCELLANEOUS) ×2 IMPLANT
COVER TIP SHEARS 8 DVNC (MISCELLANEOUS) ×1 IMPLANT
COVER TIP SHEARS 8MM DA VINCI (MISCELLANEOUS) ×1
DRAPE SHEET LG 3/4 BI-LAMINATE (DRAPES) ×4 IMPLANT
DRAPE SURG IRRIG POUCH 19X23 (DRAPES) ×2 IMPLANT
DRAPE TABLE BACK 44X90 PK DISP (DRAPES) IMPLANT
DRAPE WARM FLUID 44X44 (DRAPE) ×2 IMPLANT
DRSG TEGADERM 6X8 (GAUZE/BANDAGES/DRESSINGS) ×4 IMPLANT
ELECT REM PT RETURN 9FT ADLT (ELECTROSURGICAL) ×2
ELECTRODE REM PT RTRN 9FT ADLT (ELECTROSURGICAL) ×1 IMPLANT
GLOVE BIO SURGEON STRL SZ 6 (GLOVE) ×6 IMPLANT
GLOVE BIO SURGEON STRL SZ 6.5 (GLOVE) ×4 IMPLANT
GOWN STRL REUS W/ TWL LRG LVL3 (GOWN DISPOSABLE) ×3 IMPLANT
GOWN STRL REUS W/TWL LRG LVL3 (GOWN DISPOSABLE) ×3
HOLDER FOLEY CATH W/STRAP (MISCELLANEOUS) ×2 IMPLANT
KIT ACCESSORY DA VINCI DISP (KITS) ×1
KIT ACCESSORY DVNC DISP (KITS) ×1 IMPLANT
KIT BASIN OR (CUSTOM PROCEDURE TRAY) ×2 IMPLANT
KIT PROCEDURE DA VINCI SI (MISCELLANEOUS)
KIT PROCEDURE DVNC SI (MISCELLANEOUS) IMPLANT
LIQUID BAND (GAUZE/BANDAGES/DRESSINGS) ×2 IMPLANT
MANIPULATOR UTERINE 4.5 ZUMI (MISCELLANEOUS) ×2 IMPLANT
NDL SAFETY ECLIPSE 18X1.5 (NEEDLE) IMPLANT
NEEDLE HYPO 18GX1.5 SHARP (NEEDLE)
NEEDLE SPNL 18GX3.5 QUINCKE PK (NEEDLE) IMPLANT
OCCLUDER COLPOPNEUMO (BALLOONS) ×2 IMPLANT
PEN SKIN MARKING BROAD (MISCELLANEOUS) ×2 IMPLANT
PORT ACCESS TROCAR AIRSEAL 12 (TROCAR) IMPLANT
PORT ACCESS TROCAR AIRSEAL 5M (TROCAR)
POUCH SPECIMEN RETRIEVAL 10MM (ENDOMECHANICALS) IMPLANT
SET TRI-LUMEN FLTR TB AIRSEAL (TUBING) ×2 IMPLANT
SET TUBE IRRIG SUCTION NO TIP (IRRIGATION / IRRIGATOR) ×2 IMPLANT
SHEET LAVH (DRAPES) ×2 IMPLANT
SOLUTION ELECTROLUBE (MISCELLANEOUS) ×2 IMPLANT
SUT VIC AB 0 CT1 27 (SUTURE) ×1
SUT VIC AB 0 CT1 27XBRD ANTBC (SUTURE) ×1 IMPLANT
SUT VIC AB 4-0 PS2 27 (SUTURE) ×4 IMPLANT
SYR 50ML LL SCALE MARK (SYRINGE) ×2 IMPLANT
SYRINGE 10CC LL (SYRINGE) IMPLANT
TOWEL OR 17X26 10 PK STRL BLUE (TOWEL DISPOSABLE) ×2 IMPLANT
TOWEL OR NON WOVEN STRL DISP B (DISPOSABLE) ×2 IMPLANT
TRAP SPECIMEN MUCOUS 40CC (MISCELLANEOUS) IMPLANT
TRAY FOLEY W/METER SILVER 14FR (SET/KITS/TRAYS/PACK) ×2 IMPLANT
TRAY FOLEY W/METER SILVER 16FR (SET/KITS/TRAYS/PACK) ×2 IMPLANT
TRAY LAPAROSCOPIC (CUSTOM PROCEDURE TRAY) ×2 IMPLANT
TROCAR 12M 150ML BLUNT (TROCAR) ×2 IMPLANT
TROCAR BLADELESS OPT 5 100 (ENDOMECHANICALS) ×2 IMPLANT
TROCAR PORT AIRSEAL 5X120 (TROCAR) IMPLANT
WATER STERILE IRR 1500ML POUR (IV SOLUTION) IMPLANT

## 2014-12-11 NOTE — Anesthesia Preprocedure Evaluation (Addendum)
Anesthesia Evaluation  Patient identified by MRN, date of birth, ID band Patient awake    Reviewed: Allergy & Precautions, NPO status , Patient's Chart, lab work & pertinent test results  History of Anesthesia Complications (+) PONV and history of anesthetic complications  Airway Mallampati: II  TM Distance: >3 FB Neck ROM: Full    Dental no notable dental hx.    Pulmonary sleep apnea and Continuous Positive Airway Pressure Ventilation ,  breath sounds clear to auscultation  Pulmonary exam normal       Cardiovascular hypertension, Pt. on medications Normal cardiovascular examRhythm:Regular Rate:Normal     Neuro/Psych  Neuromuscular disease negative psych ROS   GI/Hepatic negative GI ROS, Neg liver ROS,   Endo/Other  negative endocrine ROS  Renal/GU negative Renal ROS  negative genitourinary   Musculoskeletal negative musculoskeletal ROS (+)   Abdominal (+) + obese,   Peds negative pediatric ROS (+)  Hematology negative hematology ROS (+)   Anesthesia Other Findings   Reproductive/Obstetrics negative OB ROS                           Anesthesia Physical Anesthesia Plan  ASA: II  Anesthesia Plan: General   Post-op Pain Management:    Induction: Intravenous  Airway Management Planned: Oral ETT  Additional Equipment:   Intra-op Plan:   Post-operative Plan: Extubation in OR  Informed Consent: I have reviewed the patients History and Physical, chart, labs and discussed the procedure including the risks, benefits and alternatives for the proposed anesthesia with the patient or authorized representative who has indicated his/her understanding and acceptance.   Dental advisory given  Plan Discussed with: CRNA  Anesthesia Plan Comments:         Anesthesia Quick Evaluation

## 2014-12-11 NOTE — Anesthesia Procedure Notes (Signed)
Procedure Name: Intubation Date/Time: 12/11/2014 7:40 AM Performed by: Epimenio Sarin Pre-anesthesia Checklist: Patient identified, Emergency Drugs available, Suction available, Patient being monitored and Timeout performed Patient Re-evaluated:Patient Re-evaluated prior to inductionOxygen Delivery Method: Circle system utilized Preoxygenation: Pre-oxygenation with 100% oxygen Intubation Type: IV induction Ventilation: Mask ventilation without difficulty Laryngoscope Size: Mac and 3 Grade View: Grade I Tube type: Oral Tube size: 7.5 mm Number of attempts: 1 Airway Equipment and Method: Stylet Placement Confirmation: ETT inserted through vocal cords under direct vision,  positive ETCO2 and breath sounds checked- equal and bilateral Secured at: 21 cm Tube secured with: Tape Dental Injury: Teeth and Oropharynx as per pre-operative assessment

## 2014-12-11 NOTE — Progress Notes (Signed)
O.K. To go to floor now- per Dr. Denenny. 

## 2014-12-11 NOTE — Anesthesia Postprocedure Evaluation (Signed)
  Anesthesia Post-op Note  Patient: Meredith Gilbert  Procedure(s) Performed: Procedure(s) (LRB): ROBOTIC ASSISTED TOTAL HYSTERECTOMY WITH BILATERAL SALPINGO OOPHORECTOMY (Bilateral)  Patient Location: PACU  Anesthesia Type: General  Level of Consciousness: awake and alert   Airway and Oxygen Therapy: Patient Spontanous Breathing  Post-op Pain: mild  Post-op Assessment: Post-op Vital signs reviewed, Patient's Cardiovascular Status Stable, Respiratory Function Stable, Patent Airway and No signs of Nausea or vomiting  Last Vitals:  Filed Vitals:   12/11/14 1114  BP: 143/67  Pulse: 78  Temp: 36.9 C  Resp: 13    Post-op Vital Signs: stable   Complications: No apparent anesthesia complications

## 2014-12-11 NOTE — H&P (Signed)
Meredith Gilbert 59 y.o. female requested a new patient consultation regarding complex endometrial hyperplasia.  CC:  Chief Complaint  Patient presents with  . Complex endometrial hyperplasia    pre-op    Assessment/Plan:  Meredith Gilbert is a 59 y.o. 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 She also has a family history significant for second-degree relatives with ovarian and endometrial cancer.  No BRCA or Lynch concerns based on genetic counseling.  Plan is for robotic assisted TLH, BSO. I discussed the surgical procedure and surgical risks including bleeding, infection, damage to internal organs (such as bladder,ureters, bowels), blood clot, reoperation and rehospitalization. I discussed anticipated overnight length of stay, and anticipated postoperative recovery. We discussed potential symptoms of hormone withdrawal and consideration of Estraderm replacement of these develop.  HPI: Meredith Gilbert is a 59 year old woman who underwent hysteroscopy D&C on 06/20/2014 for postmenopausal bleeding. That curetting demonstrated an endometrial polyp showing focal complex hyperplasia with focal mild atypia. She was recommended hysterectomy and BSO. She also has symptoms of mild stress urinary incontinence and her gynecologist, Dr Dema Severin, recommended TVT at the time of surgery.  The patient has a family history significant for a maternal grandmother with advanced ovarian cancer which was diagnosed in her 83s, and maternal cousin who had endometrial cancer (also in advanced stage) diagnosed in her middle of life years. The patient is morbidly obese with a BMI of 37 kg/m. She is parous having had 3 vaginal deliveries. She has not had an amenorrheic period greater than 12 months (in the past several years she's had a. Approximate every 10 months). Therefore she did not think that she was menopausal. She did have a CT scan a couple of years ago for back pain  which showed small abnormalities inside the uterus. I do not have a copy of this imaging. She also underwent an ultrasound at Dr. Orest Dikes office within the see which showed an abnormality in the endometrial lining. This is what prompted her subsequent D&C.   To being recommended hysterectomy and BSO by Dr. Dema Severin, the patient has been somewhat ambivalent about pursuing this option. She has read many resources regarding hysterectomy. She has some concern regarding complications. She asked many questions regarding what to expect after hysterectomy including prolapse, sexual function, menopause and the desire to leave residual ovarian tissue.   Current Meds:  Outpatient Encounter Prescriptions as of 11/22/2014  Medication Sig  . aspirin 325 MG tablet Take 325 mg by mouth as needed.  Marland Kitchen aspirin 81 MG tablet Take 81 mg by mouth daily.  . chlorhexidine (PERIDEX) 0.12 % solution U UTD  . Cholecalciferol (VITAMIN D-3 PO) Take 1 capsule by mouth daily.  Marland Kitchen losartan-hydrochlorothiazide (HYZAAR) 50-12.5 MG per tablet Take 1 tablet by mouth daily.   . Probiotic Product (PROBIOTIC DAILY PO) Take 1 capsule by mouth daily.  . Pyridoxine HCl (B-6 PO) Take 1 tablet by mouth daily.  Marland Kitchen VITAMIN K PO Take 1 capsule by mouth daily.   No facility-administered encounter medications on file as of 11/22/2014.    Allergy:  Allergies  Allergen Reactions  . Ibuprofen     Per pt, her blood pressure gets extremely high  . Lisinopril Cough  . Tamiflu [Oseltamivir Phosphate] Nausea And Vomiting    Social Hx:  History   Social History  . Marital Status: Married    Spouse Name: N/A  . Number of Children: N/A  . Years of Education: N/A   Occupational History  . Not  on file.   Social History Main Topics  . Smoking status: Never Smoker   . Smokeless tobacco: Never Used  . Alcohol Use: Yes     Comment: socially - very rare  .  Drug Use: No  . Sexual Activity: Yes   Other Topics Concern  . Not on file   Social History Narrative    Past Surgical Hx:  Past Surgical History  Procedure Laterality Date  . Tonsillectomy    . Tubal ligation  1993  . Dilation and curettage, diagnostic / therapeutic  1989  . Dilation and curettage of uterus  2009, 2016    polyp removal    Past Medical Hx:  Past Medical History  Diagnosis Date  . Hypertension   . Plantar fasciitis   . Carpal tunnel syndrome   . Endometrial polyp   . Sleep apnea, obstructive     Past Gynecological History: SVD x 3, complex atypical endometrial hyperplasia 2/16 No LMP recorded.  Family Hx:  Family History  Problem Relation Age of Onset  . Prostate cancer Maternal Uncle   . Ovarian cancer Maternal Grandmother   . Prostate cancer Maternal Grandfather     dx. 28s  . Lung cancer Maternal Uncle     smoker; nicotine gum  . Uterine cancer Cousin 45  . Stroke Mother   . Heart attack Father   . Other Sister     heavy bleeding and 13 exterior cysts on uterus; required TAH-BSO  . Stroke Paternal Grandmother   . Stroke Paternal Grandfather   . Kidney cancer Paternal Uncle 25    may have metastasized to lungs; smoker  . Autism Other     Review of Systems:  Constitutional  Feels well,  ENT Normal appearing ears and nares bilaterally Skin/Breast  No rash, sores, jaundice, itching, dryness Cardiovascular  No chest pain, shortness of breath, or edema  Pulmonary  No cough or wheeze.  Gastro Intestinal  No nausea, vomitting, or diarrhoea. No bright red blood per rectum, no abdominal pain, change in bowel movement, or constipation.  Genito Urinary  No frequency, urgency, dysuria, see above Musculo Skeletal  No myalgia, arthralgia, joint swelling or pain  Neurologic  No weakness, numbness, change in  gait,  Psychology  No depression, anxiety, insomnia.   Vitals: Blood pressure 142/64, pulse 68, temperature 98.7 F (37.1 C), temperature source Oral, resp. rate 18, height _0  (1.626 m), weight 215 lb 3.2 oz (97.614 kg), SpO2 100 %.  Physical Exam: General: in no distress Skin: no lesiosn Pulm: CTAB CVS: RRR Abdo: obese, soft, nontender, no masses Gyn: normal external female genitalia, normal cervix, normal small mobile uterus. Rectal: deferred   Donaciano Eva, MD

## 2014-12-11 NOTE — Op Note (Signed)
OPERATIVE NOTE 12/11/14  Surgeon: Quinn Axe   Assistants: Dr Bonnye Fava  (an MD assistant was necessary for tissue manipulation, management of robotic instrumentation, retraction and positioning due to the complexity of the case and hospital policies).   Anesthesia: General endotracheal anesthesia  ASA Class: 3   Pre-operative Diagnosis: CAH  Post-operative Diagnosis: CAH, no invasive malignancy  Operation: Robotic-assisted laparoscopic hysterectomy with bilateral salpingoophorectomy   Surgeon: Quinn Axe  Assistant Surgeon: Antionette Char MD  Anesthesia: GET  Urine Output: 100  Operative Findings:  : normal appearing uterus, tubes and ovaries, no invasive cancer on frozen  Estimated Blood Loss:  100cc      Total IV Fluids: 500 ml         Specimens: uterus, bilateral tubes and ovaries         Complications:  None; patient tolerated the procedure well.         Disposition: PACU - hemodynamically stable.  Procedure Details  The patient was seen in the Holding Room. The risks, benefits, complications, treatment options, and expected outcomes were discussed with the patient.  The patient concurred with the proposed plan, giving informed consent.  The site of surgery properly noted/marked. The patient was identified as Meredith Gilbert and the procedure verified as a Robotic-assisted hysterectomy with bilateral salpingo oophorectomy. A Time Out was held and the above information confirmed.  After induction of anesthesia, the patient was draped and prepped in the usual sterile manner. Pt was placed in supine position after anesthesia and draped and prepped in the usual sterile manner. The abdominal drape was placed after the CholoraPrep had been allowed to dry for 3 minutes.  Her arms were tucked to her side with all appropriate precautions.  The shoulders were stabilized with shoulder blocks (padded) on the acromium processes.  The patient was  placed in the semi-lithotomy position in [place] stirrups.  The perineum was prepped with Betadine.  Foley catheter was placed.  A sterile speculum was placed in the vagina.  The cervix was grasped with a single-tooth tenaculum and dilated with Shawnie Pons dilators.  The ZUMI uterine manipulator with a medium colpotomizer ring was placed without difficulty.  A pneum occluder balloon was placed over the manipulator.  A second time-out was performed.  OG tube placement was confirmed and to suction.    Procedure:  The patient was brought to the operating room where general anesthesia was administered with no complications.  The patient was placed in the dorsal lithotomy position in padded [place] stirrups.  The arms were tucked at the sides with gel pads protecting the elbows and foam protecting the hands. The patient was then prepped.  A Foley was placed to gravity.  A medium size KOH ring was used to place around the cervix after the cervix had been dilated and then a RUMI manipulator was attached in the normal manner.  The patient was then draped in the normal manner.  Next, a 5 mm skin incision was made 1 cm below the subcostal margin in the midclavicular line.  The 5 mm Optiview port and scope was used for direct entry.  Opening pressure was under 10 mm CO2.  The abdomen was insufflated and the findings were noted as above.   At this point and all points during the procedure, the patient's intra-abdominal pressure did not exceed 15 mmHg. Next, a 10 mm skin incision was made in the umbilicus and a right and left port was placed about 10 cm lateral  to the robot port on the right and left side.  All ports were placed under direct visualization.  The patient was placed in steep Trendelenburg.  Bowel was away into the upper abdomen.  The robot was docked in the normal manner.  The hysterectomy was started after the round ligament on the right side was incised and the retroperitoneum was entered and the pararectal space was  developed.  The ureter was noted to be on the medial leaf of the broad ligament.  The peritoneum above the ureter was incised and stretched and the infundibulopelvic ligament was skeletonized, cauterized and cut.  The posterior peritoneum was taken down to the level of the KOH ring.  The anterior peritoneum was also taken down.  The bladder flap was created to the level of the KOH ring.  The uterine artery on the right side was skeletonized, cauterized and cut in the normal manner.  A similar procedure was performed on the left.  The colpotomy was made and the uterus, cervix, bilateral ovaries and tubes were amputated and delivered through the vagina.  Pedicles were inspected and excellent hemostasis was achieved.    The colpotomy at the vaginal cuff was closed with Vicryl on a CT1 needle in a running manner.  Irrigation was used and excellent hemostasis was achieved.  At this point in the procedure was completed.  Robotic instruments were removed under direct visulaization.  The robot was undocked. The 10 mm ports were closed with Vicryl on a UR-5 needle and the fascia was closed with 0 Vicryl on a UR-5 needle.  The skin was closed with 4-0 Vicryl in a subcuticular manner.  Dermabond was applied.  Sponge, lap and needle counts correct x 2.  The patient was taken to the recovery room in stable condition.  The vagina was swabbed with  minimal bleeding noted.   All instrument and needle counts were correct x  3.   The patient was transferred to the recovery room in a stable condition.   Quinn Axe, MD

## 2014-12-11 NOTE — Transfer of Care (Signed)
Immediate Anesthesia Transfer of Care Note  Patient: Meredith Gilbert  Procedure(s) Performed: Procedure(s): ROBOTIC ASSISTED TOTAL HYSTERECTOMY WITH BILATERAL SALPINGO OOPHORECTOMY (Bilateral)  Patient Location: PACU  Anesthesia Type:General  Level of Consciousness:  sedated, patient cooperative and responds to stimulation  Airway & Oxygen Therapy:Patient Spontanous Breathing and Patient connected to face mask oxgen  Post-op Assessment:  Report given to PACU RN and Post -op Vital signs reviewed and stable  Post vital signs:  Reviewed and stable  Last Vitals:  Filed Vitals:   12/11/14 0555  BP: 154/69  Pulse: 65  Temp: 36.9 C  Resp: 18    Complications: No apparent anesthesia complications

## 2014-12-12 ENCOUNTER — Encounter (HOSPITAL_COMMUNITY): Payer: Self-pay | Admitting: Gynecologic Oncology

## 2014-12-12 DIAGNOSIS — N8501 Benign endometrial hyperplasia: Secondary | ICD-10-CM | POA: Diagnosis not present

## 2014-12-12 LAB — CBC
HEMATOCRIT: 36.7 % (ref 36.0–46.0)
HEMOGLOBIN: 11.9 g/dL — AB (ref 12.0–15.0)
MCH: 27.7 pg (ref 26.0–34.0)
MCHC: 32.4 g/dL (ref 30.0–36.0)
MCV: 85.5 fL (ref 78.0–100.0)
Platelets: 297 10*3/uL (ref 150–400)
RBC: 4.29 MIL/uL (ref 3.87–5.11)
RDW: 13.2 % (ref 11.5–15.5)
WBC: 14.2 10*3/uL — ABNORMAL HIGH (ref 4.0–10.5)

## 2014-12-12 LAB — BASIC METABOLIC PANEL
Anion gap: 7 (ref 5–15)
BUN: 12 mg/dL (ref 6–20)
CHLORIDE: 105 mmol/L (ref 101–111)
CO2: 27 mmol/L (ref 22–32)
CREATININE: 0.75 mg/dL (ref 0.44–1.00)
Calcium: 8.9 mg/dL (ref 8.9–10.3)
GFR calc Af Amer: >60 mL/min (ref >60)
GFR calc non Af Amer: >60 mL/min (ref >60)
GLUCOSE: 134 mg/dL — AB (ref 65–99)
Potassium: 3.8 mmol/L (ref 3.5–5.1)
Sodium: 139 mmol/L (ref 135–145)

## 2014-12-12 MED ORDER — OXYCODONE-ACETAMINOPHEN 5-325 MG PO TABS: 1.0000 | ORAL_TABLET | ORAL | Status: DC | PRN

## 2014-12-12 NOTE — Discharge Instructions (Signed)
12/12/2014  Return to work: 4-6 weeks if applicable  Activity: 1. Be up and out of the bed during the day.  Take a nap if needed.  You may walk up steps but be careful and use the hand rail.  Stair climbing will tire you more than you think, you may need to stop part way and rest.   2. No lifting or straining for 6 weeks.  3. No driving for 1 week(s).  Do not drive if you are taking narcotic pain medicine.  4. Shower daily.  Use soap and water on your incision and pat dry; don't rub.  No tub baths until cleared by your surgeon.   5. No sexual activity and nothing in the vagina for 8 weeks.  Diet: 1. Low sodium Heart Healthy Diet is recommended.  2. It is safe to use a laxative, such as Miralax or Colace, if you have difficulty moving your bowels.   Wound Care: 1. Keep clean and dry.  Shower daily.  Reasons to call the Doctor:  Fever - Oral temperature greater than 100.4 degrees Fahrenheit  Foul-smelling vaginal discharge  Difficulty urinating  Nausea and vomiting  Increased pain at the site of the incision that is unrelieved with pain medicine.  Difficulty breathing with or without chest pain  New calf pain especially if only on one side  Sudden, continuing increased vaginal bleeding with or without clots.   Contacts: For questions or concerns you should contact:  Dr. Adolphus Birchwood at [phone]  Warner Mccreedy, NP at [phone]  After Hours: call [phone] and ask for the GYN Oncologist on call  Abdominal Hysterectomy, Care After These instructions give you information on caring for yourself after your procedure. Your doctor may also give you more specific instructions. Call your doctor if you have any problems or questions after your procedure.  HOME CARE It takes 4-6 weeks to recover from this surgery. Follow all of your doctor's instructions.   Only take medicines as told by your doctor.  Change your bandage as told by your doctor.  Return to your  doctor to have your stitches taken out.  Take showers for 2-3 weeks. Ask your doctor when it is okay to shower.  Do not douche, use tampons, or have sex (intercourse) for at least 6 weeks or as told.  Follow your doctor's advice about exercise, lifting objects, driving, and general activities.  Get plenty of rest and sleep.  Do not lift anything heavier than a gallon of milk (about 10 pounds [4.5 kilograms]) for the first month after surgery.  Get back to your normal diet as told by your doctor.  Do not drink alcohol until your doctor says it is okay.  Take a medicine to help you poop (laxative) as told by your doctor.  Eating foods high in fiber may help you poop. Eat a lot of raw fruits and vegetables, whole grains, and beans.  Drink enough fluids to keep your pee (urine) clear or pale yellow.  Have someone help you at home for 1-2 weeks after your surgery.  Keep follow-up doctor visits as told. GET HELP IF:  You have chills or fever.  You have puffiness, redness, or pain in area of the cut (incision).  You have yellowish-white fluid (pus) coming from the cut.  You have a bad smell coming from the cut or bandage.  Your cut pulls apart.  You feel dizzy or light-headed.  You have pain or bleeding when you pee.  You keep  having watery poop (diarrhea).  You keep feeling sick to your stomach (nauseous) or keep throwing up (vomiting).  You have fluid (discharge) coming from your vagina.  You have a rash.  You have a reaction to your medicine.  You need stronger pain medicine. GET HELP RIGHT AWAY IF:   You have a fever and your symptoms suddenly get worse.  You have bad belly (abdominal) pain.  You have chest pain.  You are short of breath.  You pass out (faint).  You have pain, puffiness, or redness of your leg.  You bleed a lot from your vagina and notice clumps of tissue (clots). MAKE SURE YOU:   Understand these instructions.  Will watch your  condition.  Will get help right away if you are not doing well or get worse. Document Released: 01/21/2008 Document Revised: 04/18/2013 Document Reviewed: 02/03/2013 [facility] Patient Information 2015 [place], [place]. This information is not intended to replace advice given to you by your health care provider. Make sure you discuss any questions you have with your health care provider.  Acetaminophen; Oxycodone tablets What is this medicine? ACETAMINOPHEN; OXYCODONE (a set a MEE noe fen; ox i KOE done) is a pain reliever. It is used to treat mild to moderate pain. This medicine may be used for other purposes; ask your health care provider or pharmacist if you have questions. COMMON BRAND NAME(S): Endocet, Magnacet, Narvox, Percocet, Perloxx, Primalev, Primlev, Roxicet, Xolox What should I tell my health care provider before I take this medicine? They need to know if you have any of these conditions: -brain tumor -Crohn's disease, inflammatory bowel disease, or ulcerative colitis -drug abuse or addiction -head injury -heart or circulation problems -if you often drink alcohol -kidney disease or problems going to the bathroom -liver disease -lung disease, asthma, or breathing problems -an unusual or allergic reaction to acetaminophen, oxycodone, other opioid analgesics, other medicines, foods, dyes, or preservatives -pregnant or trying to get pregnant -breast-feeding How should I use this medicine? Take this medicine by mouth with a full glass of water. Follow the directions on the prescription label. Take your medicine at regular intervals. Do not take your medicine more often than directed. Talk to your pediatrician regarding the use of this medicine in children. Special care may be needed. Patients over 69 years old may have a stronger reaction and need a smaller dose. Overdosage: If you think you have taken too much of this medicine contact a poison control center or emergency room at  once. NOTE: This medicine is only for you. Do not share this medicine with others. What if I miss a dose? If you miss a dose, take it as soon as you can. If it is almost time for your next dose, take only that dose. Do not take double or extra doses. What may interact with this medicine? -alcohol -antihistamines -barbiturates like amobarbital, butalbital, butabarbital, methohexital, pentobarbital, phenobarbital, thiopental, and secobarbital -benztropine -drugs for bladder problems like solifenacin, trospium, oxybutynin, tolterodine, hyoscyamine, and methscopolamine -drugs for breathing problems like ipratropium and tiotropium -drugs for certain stomach or intestine problems like propantheline, homatropine methylbromide, glycopyrrolate, atropine, belladonna, and dicyclomine -general anesthetics like etomidate, ketamine, nitrous oxide, propofol, desflurane, enflurane, halothane, isoflurane, and sevoflurane -medicines for depression, anxiety, or psychotic disturbances -medicines for sleep -muscle relaxants -naltrexone -narcotic medicines (opiates) for pain -phenothiazines like perphenazine, thioridazine, chlorpromazine, mesoridazine, fluphenazine, prochlorperazine, promazine, and trifluoperazine -scopolamine -tramadol -trihexyphenidyl This list may not describe all possible interactions. Give your health care provider a list of  all the medicines, herbs, non-prescription drugs, or dietary supplements you use. Also tell them if you smoke, drink alcohol, or use illegal drugs. Some items may interact with your medicine. What should I watch for while using this medicine? Tell your doctor or health care professional if your pain does not go away, if it gets worse, or if you have new or a different type of pain. You may develop tolerance to the medicine. Tolerance means that you will need a higher dose of the medication for pain relief. Tolerance is normal and is expected if you take this medicine for  a long time. Do not suddenly stop taking your medicine because you may develop a severe reaction. Your body becomes used to the medicine. This does NOT mean you are addicted. Addiction is a behavior related to getting and using a drug for a non-medical reason. If you have pain, you have a medical reason to take pain medicine. Your doctor will tell you how much medicine to take. If your doctor wants you to stop the medicine, the dose will be slowly lowered over time to avoid any side effects. You may get drowsy or dizzy. Do not drive, use machinery, or do anything that needs mental alertness until you know how this medicine affects you. Do not stand or sit up quickly, especially if you are an older patient. This reduces the risk of dizzy or fainting spells. Alcohol may interfere with the effect of this medicine. Avoid alcoholic drinks. There are different types of narcotic medicines (opiates) for pain. If you take more than one type at the same time, you may have more side effects. Give your health care provider a list of all medicines you use. Your doctor will tell you how much medicine to take. Do not take more medicine than directed. Call emergency for help if you have problems breathing. The medicine will cause constipation. Try to have a bowel movement at least every 2 to 3 days. If you do not have a bowel movement for 3 days, call your doctor or health care professional. Do not take Tylenol (acetaminophen) or medicines that have acetaminophen with this medicine. Too much acetaminophen can be very dangerous. Many nonprescription medicines contain acetaminophen. Always read the labels carefully to avoid taking more acetaminophen. What side effects may I notice from receiving this medicine? Side effects that you should report to your doctor or health care professional as soon as possible: -allergic reactions like skin rash, itching or hives, swelling of the face, lips, or tongue -breathing difficulties,  wheezing -confusion -light headedness or fainting spells -severe stomach pain -unusually weak or tired -yellowing of the skin or the whites of the eyes Side effects that usually do not require medical attention (report to your doctor or health care professional if they continue or are bothersome): -dizziness -drowsiness -nausea -vomiting This list may not describe all possible side effects. Call your doctor for medical advice about side effects. You may report side effects to FDA at 1-800-FDA-1088. Where should I keep my medicine? Keep out of the reach of children. This medicine can be abused. Keep your medicine in a safe place to protect it from theft. Do not share this medicine with anyone. Selling or giving away this medicine is dangerous and against the law. Store at room temperature between 20 and 25 degrees C (68 and 77 degrees F). Keep container tightly closed. Protect from light. This medicine may cause accidental overdose and death if it is taken by other adults,  children, or pets. Flush any unused medicine down the toilet to reduce the chance of harm. Do not use the medicine after the expiration date. NOTE: This sheet is a summary. It may not cover all possible information. If you have questions about this medicine, talk to your doctor, pharmacist, or health care provider.  2015, [employer]. (2012-12-05 13:17:35)

## 2014-12-12 NOTE — Progress Notes (Signed)
Pt being discharged home with spouse.  IV discontinued, discharge instructions and follow up appointment reveiewed with patient.  Patient verbalized understanding.

## 2014-12-12 NOTE — Discharge Summary (Signed)
Physician Discharge Summary  Patient ID: Basya Casavant MRN: [id number] DOB/AGE: 07/22/1955 59 y.o.  Admit date: 12/11/2014 Discharge date: 12/12/2014  Admission Diagnoses: Complex endometrial hyperplasia with atypia  Discharge Diagnoses:  Principal Problem:   Complex endometrial hyperplasia with atypia Active Problems:   Endometrial hyperplasia   Discharged Condition:  The patient is in good condition and stable for discharge.    Hospital Course: On 12/11/2014, the patient underwent the following: Procedure(s): ROBOTIC ASSISTED TOTAL HYSTERECTOMY WITH BILATERAL SALPINGO OOPHORECTOMY.  The postoperative course was uneventful.  She was discharged to home on postoperative day 1 tolerating a regular diet, voiding without difficulty, ambulating without difficulty, with minimal pain.  Consults: None  Significant Diagnostic Studies: None  Treatments: surgery: see above  Discharge Exam: Blood pressure 125/52, pulse 56, temperature 98.2 F (36.8 C), temperature source Oral, resp. rate 18, height 5' 3.75" (1.619 m), weight 213 lb 2 oz (96.673 kg), SpO2 95 %. General appearance: alert, cooperative and no distress Resp: clear to auscultation bilaterally Cardio: regular rate and rhythm, S1, S2 normal, no murmur, click, rub or gallop GI: soft, non-tender; bowel sounds normal; no masses,  no organomegaly Extremities: extremities normal, atraumatic, no cyanosis or edema Incision/Wound: Lap sites with dermabond without erythema or drainage  Disposition: Home  Discharge Instructions    Call MD for:  difficulty breathing, headache or visual disturbances    Complete by:  As directed      Call MD for:  extreme fatigue    Complete by:  As directed      Call MD for:  hives    Complete by:  As directed      Call MD for:  persistant dizziness or light-headedness    Complete by:  As directed      Call MD for:  persistant nausea and vomiting    Complete by:  As directed      Call MD for:   redness, tenderness, or signs of infection (pain, swelling, redness, odor or green/yellow discharge around incision site)    Complete by:  As directed      Call MD for:  severe uncontrolled pain    Complete by:  As directed      Call MD for:  temperature >100.4    Complete by:  As directed      Diet - low sodium heart healthy    Complete by:  As directed      Driving Restrictions    Complete by:  As directed   No driving for 1 week.  Do not take narcotics and drive.     Increase activity slowly    Complete by:  As directed      Lifting restrictions    Complete by:  As directed   No lifting greater than 10 lbs.     Sexual Activity Restrictions    Complete by:  As directed   No sexual activity, nothing in the vagina, for 8 weeks.            Medication List    TAKE these medications        B-6 PO  Take 1 tablet by mouth daily as needed (carpal tunnel).     chlorhexidine 0.12 % solution  Commonly known as:  PERIDEX  mouth rinse     loratadine 10 MG tablet  Commonly known as:  CLARITIN  Take 10 mg by mouth daily as needed for allergies or itching.     losartan-hydrochlorothiazide 50-12.5 MG per tablet  Commonly  known as:  HYZAAR  Take 1 tablet by mouth daily.     naproxen sodium 220 MG tablet  Commonly known as:  ANAPROX  Take 220 mg by mouth 2 (two) times daily as needed (pain).     oxyCODONE-acetaminophen 5-325 MG per tablet  Commonly known as:  PERCOCET/ROXICET  Take 1-2 tablets by mouth every 4 (four) hours as needed for severe pain (moderate to severe pain).     PROBIOTIC DAILY PO  Take 1 capsule by mouth daily.     TYLENOL PO  Take 2 tablets by mouth daily as needed (pain).     Vitamin D3 5000 UNITS Caps  Take 5,000 Units by mouth daily.           Follow-up Information    Follow up with Quinn Axe, MD On 01/04/2015.   Specialty:  Obstetrics and Gynecology   Why:  at 1:30pm at the [facility] information:   [address]  [place] [address] [phone]       Greater than thirty minutes were spend for face to face discharge instructions and discharge orders/summary in EPIC.   Signed: Starlene Consuegra DEAL 12/12/2014, 11:00 AM

## 2014-12-14 ENCOUNTER — Telehealth: Payer: Self-pay | Admitting: Gynecologic Oncology

## 2014-12-14 NOTE — Telephone Encounter (Signed)
Informed patient of benign pathology results. She is doing well postop with no concerns. Quinn Axe, MD

## 2014-12-17 ENCOUNTER — Telehealth: Payer: Self-pay | Admitting: *Deleted

## 2014-12-17 NOTE — Telephone Encounter (Signed)
Message left with future appointment details. Pt is scheduled 01/04/15 @ 1:30pm with Dr. Andrey Farmer

## 2015-01-04 ENCOUNTER — Ambulatory Visit: Payer: BLUE CROSS/BLUE SHIELD | Admitting: Gynecologic Oncology

## 2015-01-07 ENCOUNTER — Encounter: Payer: Self-pay | Admitting: Gynecologic Oncology

## 2015-01-07 ENCOUNTER — Ambulatory Visit: Payer: BLUE CROSS/BLUE SHIELD | Attending: Gynecologic Oncology | Admitting: Gynecologic Oncology

## 2015-01-07 DIAGNOSIS — N8502 Endometrial intraepithelial neoplasia [EIN]: Secondary | ICD-10-CM | POA: Insufficient documentation

## 2015-01-07 NOTE — Patient Instructions (Signed)
Plan to follow up with your GYN, Dr. Cliffton Asters, or PCP as scheduled for annual well woman care.  Please call for any questions or concerns.  Follow up with Dr. Andrey Farmer on an as needed basis if issues or concerns arise in the future.

## 2015-01-07 NOTE — Progress Notes (Signed)
POSTOPERATIVE FOLLOWUP VISIT   Assessment:    59 y.o. year old with a history of endometrial hyperplasia.   S/p robotic hysterectomy and BSO on 12/11/14.   Plan: 1) Pathology reports reviewed today 2) Treatment counseling - no malignancy or hyperplasia in the hysterectomy or oophorectomy specimens. No further treatment/followup necessary. As she has a history of prior normal paps, she does not require ongoing vaginal cytology. She was given the opportunity to ask questions, which were answered to her satisfaction, and she is agreement with the above mentioned plan of care.  3)  Return to clinic on a prn basis. She will return to see Dr Cliffton Asters annually for well-woman checks.  HPI:  Meredith Gilbert is a 59 y.o. year old No obstetric history on file. initially seen in consultation on 10/05/14 referred by Dr Arnette Schaumann for complex atypical endometrial hyperplasia.  She then underwent a robotic hysterectomy and BSO on 12/11/14 without complications.  Her postoperative course was uncomplicated.  Her final pathology revealed a benign endometrial polyp, no residual hyperplasia or malignancy, a normal cervix and bilateral adnexa.  She is seen today for a postoperative check and to discuss her pathology results and ongoing plan.  Since discharge from the hospital, she is feeling well.  She has improving appetite, normal bowel and bladder function, and pain controlled with minimal PO medication. She has no other complaints today.    Review of systems: Constitutional:  She has no weight gain or weight loss. She has no fever or chills. Eyes: No blurred vision Ears, Nose, Mouth, Throat: No dizziness, headaches or changes in hearing. No mouth sores. Cardiovascular: No chest pain, palpitations or edema. Respiratory:  No shortness of breath, wheezing or cough Gastrointestinal: She has normal bowel movements without diarrhea or constipation. She denies any nausea or vomiting. She denies blood in her stool or  heart burn. Genitourinary:  She denies pelvic pain, pelvic pressure or changes in her urinary function. She has no hematuria, dysuria, or incontinence. She has no irregular vaginal bleeding or vaginal discharge Musculoskeletal: Denies muscle weakness or joint pains.  Skin:  She has no skin changes, rashes or itching Neurological:  Denies dizziness or headaches. No neuropathy, no numbness or tingling. Psychiatric:  She denies depression or anxiety. Hematologic/Lymphatic:   No easy bruising or bleeding   Physical Exam: There were no vitals taken for this visit. General: Well dressed, well nourished in no apparent distress.   HEENT:  Normocephalic and atraumatic, no lesions.  Extraocular muscles intact. Sclerae anicteric. Pupils equal, round, reactive. No mouth sores or ulcers. Thyroid is normal size, not nodular, midline. Skin:  No lesions or rashes. Abdomen:  Soft, nontender, nondistended.  No palpable masses.  No hepatosplenomegaly.  No ascites. Normal bowel sounds.  No hernias.  Incisions are well healed Genitourinary: Normal EGBUS  Vaginal cuff intact.  No bleeding or discharge.  No cul de sac fullness. Extremities: No cyanosis, clubbing or edema.  No calf tenderness or erythema. No palpable cords. Psychiatric: Mood and affect are appropriate. Neurological: Awake, alert and oriented x 3. Sensation is intact, no neuropathy.  Musculoskeletal: No pain, normal strength and range of motion.  Quinn Axe, MD

## 2015-03-19 ENCOUNTER — Ambulatory Visit (INDEPENDENT_AMBULATORY_CARE_PROVIDER_SITE_OTHER): Payer: BLUE CROSS/BLUE SHIELD | Admitting: Nurse Practitioner

## 2015-03-19 ENCOUNTER — Encounter: Payer: Self-pay | Admitting: Nurse Practitioner

## 2015-03-19 VITALS — BP 140/76 | HR 72 | Ht 64.0 in | Wt 215.0 lb

## 2015-03-19 DIAGNOSIS — I10 Essential (primary) hypertension: Secondary | ICD-10-CM | POA: Diagnosis not present

## 2015-03-19 DIAGNOSIS — N8501 Benign endometrial hyperplasia: Secondary | ICD-10-CM | POA: Diagnosis not present

## 2015-03-19 DIAGNOSIS — Z01419 Encounter for gynecological examination (general) (routine) without abnormal findings: Secondary | ICD-10-CM

## 2015-03-19 DIAGNOSIS — E559 Vitamin D deficiency, unspecified: Secondary | ICD-10-CM | POA: Diagnosis not present

## 2015-03-19 DIAGNOSIS — Z0000 Encounter for general adult medical examination without abnormal findings: Secondary | ICD-10-CM | POA: Diagnosis not present

## 2015-03-19 NOTE — Progress Notes (Signed)
Patient ID: Meredith Gilbert, female   DOB: [birth year], 57 y.o.   MRN: [id number] 59 y.o. [id number] Married  Caucasian Fe here for NGYN annual exam.  Patient presents with history of AUB a year ago with diagnosis for complex endometrial hyperplasia and polyp.  She then had TAH/BSO on 12/11/14 by Dr. Lorie Apley.  Pathology did not show cancer.  No previous history of abnormal pap's.  Last Vit D was at 21.6 on 10/2014 then 53.4 on 02/22/15.  Going to [facility] and had hormone testing before surgery and all test showed she was already in menopause.  She was given DHEA and Pregnenolone and doing well. Symptoms of brain fog and fatigue have improved.  Patient's last menstrual period was 02/25/2014 (approximate).          Sexually active: Yes.    The current method of family planning is status post hysterectomy and post menopausal status.    Exercising: No.  Exercise is limited by plantar faciitis. Smoker:  no  Health Maintenance: Pap:  09/2012, Negative  MMG:  11/2014, cyst on ultrasound, repeat in one year, had previous 6 month follow ups, [facility]:  10/2010, polyp, repeat in 10 years, [facility] BMD:   2009, normal per patient TDaP:  2007 Labs: PCP and Dr. Andrey Farmer   reports that she has never smoked. She has never used smokeless tobacco. She reports that she drinks alcohol. She reports that she does not use illicit drugs.  Past Medical History  Diagnosis Date  . Hypertension   . Plantar fasciitis   . Endometrial polyp   . Sleep apnea, obstructive     cpap used(automatic 5-15)  . PONV (postoperative nausea and vomiting)     "heart felt it was going to jump out of my chest" "grimaces teeth-mouth guard used"  . Carpal tunnel syndrome     bilateral remains an issue- Right rotator cuff issue. Left plantar fasciatis  . Cancer [facility])     Complex  hyperplasia -endometrial    Past Surgical History  Procedure Laterality Date  . Tonsillectomy    . Tubal  ligation  1993  . Dilation and curettage, diagnostic / therapeutic  1989  . Dilation and curettage of uterus  2009, 2016    polyp removal- x3 total ('89)  . Robotic assisted total hysterectomy with bilateral salpingo oopherectomy Bilateral 12/11/2014    Procedure: ROBOTIC ASSISTED TOTAL HYSTERECTOMY WITH BILATERAL SALPINGO OOPHORECTOMY;  Surgeon: Adolphus Birchwood, MD;  Location: [facility];  Service: Gynecology;  Laterality: Bilateral;    Current Outpatient Prescriptions  Medication Sig Dispense Refill  . Acetaminophen (TYLENOL PO) Take 2 tablets by mouth daily as needed (pain).    Marland Kitchen b complex vitamins capsule Take 1 capsule by mouth daily.    . chlorhexidine (PERIDEX) 0.12 % solution mouth rinse  0  . Cholecalciferol (VITAMIN D3) 5000 UNITS CAPS Take 5,000 Units by mouth 3 (three) times daily.     Marland Kitchen loratadine (CLARITIN) 10 MG tablet Take 10 mg by mouth daily as needed for allergies or itching.    . losartan-hydrochlorothiazide (HYZAAR) 50-12.5 MG per tablet Take 1 tablet by mouth daily.     . Menaquinone-7 (VITAMIN K2 PO) Take 150 mcg by mouth daily.    . NON FORMULARY Pregnenolone , one tablet daily    . Nutritional Supplements (DHEA PO) Take 15 mg by mouth.    . Probiotic Product (PROBIOTIC DAILY PO) Take 1 capsule by mouth daily.    . Turmeric 500 MG  TABS Take 1 tablet by mouth daily.     No current facility-administered medications for this visit.    Family History  Problem Relation Age of Onset  . Prostate cancer Maternal Uncle   . Ovarian cancer Maternal Grandmother   . Prostate cancer Maternal Grandfather     dx. 4270s  . Lung cancer Maternal Uncle     smoker; nicotine gum  . Uterine cancer Cousin 61  . Stroke Mother   . Heart attack Father   . Other Sister     heavy bleeding and 13 exterior cysts on uterus; required TAH-BSO  . Stroke Paternal Grandmother   . Stroke Paternal Grandfather   . Kidney cancer Paternal Uncle [id number]    may have metastasized to lungs; smoker  . Autism  Other     ROS:  Pertinent items are noted in HPI.  Otherwise, a comprehensive ROS was negative.  Exam:   BP 140/76 mmHg  Pulse 72  Ht 5\' 4"  (1.626 m)  Wt 215 lb (97.523 kg)  BMI 36.89 kg/m2  LMP 02/25/2014 (Approximate) Height: 5\' 4"  (162.6 cm) Ht Readings from Last 3 Encounters:  03/19/15 5\' 4"  (1.626 m)  12/11/14 5' 3.75" (1.619 m)  12/07/14 5' 3.75" (1.619 m)    General appearance: alert, cooperative and appears stated age Head: Normocephalic, without obvious abnormality, atraumatic Neck: no adenopathy, supple, symmetrical, trachea midline and thyroid normal to inspection and palpation Lungs: clear to auscultation bilaterally Breasts: normal appearance, no masses or tenderness Heart: regular rate and rhythm Abdomen: soft, non-tender; no masses,  no organomegaly Extremities: extremities normal, atraumatic, no cyanosis or edema Skin: Skin color, texture, turgor normal. No rashes or lesions Lymph nodes: Cervical, supraclavicular, and axillary nodes normal. No abnormal inguinal nodes palpated Neurologic: Grossly normal   Pelvic: External genitalia:  no lesions              Urethra:  normal appearing urethra with no masses, tenderness or lesions              Bartholin's and Skene's: normal                 Vagina: normal appearing vagina with normal color and discharge, no lesions              Cervix: absent              Pap taken: No. Bimanual Exam:  Uterus:  uterus absent              Adnexa: no mass, fullness, tenderness               Rectovaginal: Confirms               Anus:  normal sphincter tone, no lesions  Chaperone present: yes  A:  Well Woman with normal exam  History of complex endometrial hyperplasia  S/P TAH/ BSO 12/11/14 by Dr. Andrey Farmerossi  History of Vit D deficiency, HTN, sleep apnea    P:   Reviewed health and wellness pertinent to exam  Pap smear as above  Mammogram is due 11/2015  She will follow with [facility] for her Pregnenolone  and DHEA  Counseled on breast self exam, adequate intake of calcium and vitamin D, diet and exercise, Kegel's exercises return annually or prn  An After Visit Summary was printed and given to the patient.

## 2015-03-19 NOTE — Patient Instructions (Signed)

## 2015-03-19 NOTE — Progress Notes (Signed)
Encounter reviewed by Dr. Brook Amundson C. Silva.  

## 2015-05-08 DIAGNOSIS — J309 Allergic rhinitis, unspecified: Secondary | ICD-10-CM | POA: Insufficient documentation

## 2015-05-08 DIAGNOSIS — N6019 Diffuse cystic mastopathy of unspecified breast: Secondary | ICD-10-CM | POA: Insufficient documentation

## 2015-05-08 DIAGNOSIS — G5600 Carpal tunnel syndrome, unspecified upper limb: Secondary | ICD-10-CM | POA: Insufficient documentation

## 2015-05-08 DIAGNOSIS — G4730 Sleep apnea, unspecified: Secondary | ICD-10-CM | POA: Insufficient documentation

## 2015-05-08 DIAGNOSIS — I10 Essential (primary) hypertension: Secondary | ICD-10-CM | POA: Insufficient documentation

## 2015-06-12 DIAGNOSIS — M722 Plantar fascial fibromatosis: Secondary | ICD-10-CM | POA: Insufficient documentation

## 2015-06-12 DIAGNOSIS — E669 Obesity, unspecified: Secondary | ICD-10-CM | POA: Insufficient documentation

## 2015-08-08 DIAGNOSIS — R002 Palpitations: Secondary | ICD-10-CM | POA: Diagnosis not present

## 2015-08-08 DIAGNOSIS — E876 Hypokalemia: Secondary | ICD-10-CM | POA: Diagnosis not present

## 2015-09-16 DIAGNOSIS — R42 Dizziness and giddiness: Secondary | ICD-10-CM | POA: Insufficient documentation

## 2015-09-26 DIAGNOSIS — M7671 Peroneal tendinitis, right leg: Secondary | ICD-10-CM | POA: Diagnosis not present

## 2015-11-11 DIAGNOSIS — I4891 Unspecified atrial fibrillation: Secondary | ICD-10-CM | POA: Diagnosis not present

## 2015-11-18 ENCOUNTER — Telehealth: Payer: Self-pay | Admitting: Nurse Practitioner

## 2015-11-18 NOTE — Telephone Encounter (Signed)
Patient is requesting a referral for a diagnostic MMG.

## 2015-11-18 NOTE — Telephone Encounter (Signed)
Return call to patient. She is requesting an order for follow up breast imaging at [facility]. She denies any breast problems at this time.  She is due for recall.  Order for diagnostic bilateral breast with [facility] Breast Ultrasound sent at this time.  Patient will call back with any concerns.

## 2015-11-19 ENCOUNTER — Ambulatory Visit (INDEPENDENT_AMBULATORY_CARE_PROVIDER_SITE_OTHER): Payer: BLUE CROSS/BLUE SHIELD | Admitting: Nurse Practitioner

## 2015-11-19 ENCOUNTER — Encounter: Payer: Self-pay | Admitting: Nurse Practitioner

## 2015-11-19 VITALS — BP 122/64 | HR 52 | Ht 64.0 in | Wt 207.0 lb

## 2015-11-19 DIAGNOSIS — Z79890 Hormone replacement therapy (postmenopausal): Secondary | ICD-10-CM

## 2015-11-19 DIAGNOSIS — Z1159 Encounter for screening for other viral diseases: Secondary | ICD-10-CM | POA: Diagnosis not present

## 2015-11-19 NOTE — Patient Instructions (Signed)
Will refill HRT if Mammo is normal.  If we do at annual or before.

## 2015-11-19 NOTE — Progress Notes (Deleted)
Subjective:     Patient ID: Meredith Gilbert, female   DOB: February 14, 1956, 60 y.o.   MRN: [id number]  HPI  This 60 yo WM female presnts today as a consult to discuss HRT.  She has been seen at [facility] for the past year.  She has been on a combination of Estradiol, Prometrium, DHEA and Pregnanlone.  She has already stopped the DHEA secondary to the caffiene as she feels this has contributed to her heart palpitations.  She is ready to stop the Pregnanlone.  She now has a history of elevated BP and pulse rate after an ED visit and is on Cozaar 10 mg daily.  She did have a  Echo done 11/11/15 by PCP.  The cardiology visit says she does not have A Fib which was origianally thought by ED.   Currently on Estrace 0.5 mg and Prometrium 100 mg.   Not having vaso symptoms, vaginal dryness is better, some improved libido. She is scheduled to have a diagnostic Mammo on 8/10.  She is getting her HRT from [facility] but the person she sees may be leaving and wants [place] to refill when needed.  Her AEX is not due until November.   Review of Systems     Objective:   Physical Exam     Assessment:     ***    Plan:     ***

## 2015-11-19 NOTE — Progress Notes (Signed)
Patient ID: Meredith Gilbert, female   DOB: Jun 12, 1955, 60 y.o.   MRN: [id number]  S:  HPI  This 60 yo WM female presents today as a consult to discuss HRT.  She has been seen at [facility] for the past year.  She has been on a combination of Estradiol, Prometrium, DHEA and Pregnenolone.  She has already stopped the DHEA secondary to the caffeine as she feels this has contributed to her heart palpitations.  She is ready to stop the Pregnenolone.  She now has a history of elevated BP and pulse rate after an ED visit and is on Cozaar 10 mg daily.  She did have a  Echo done 11/11/15 by PCP.  The cardiology visit says she does not have A Fib which was originally thought by ED.   Currently on Estrace 0.5 mg and Prometrium 100 mg.   Not having vaso symptoms, vaginal dryness is better, some improved libido. She is scheduled to have a diagnostic Mammo on 8/10.  She is getting her HRT from [facility] but the person she sees may be leaving and wants [place] to refill when needed.  Her AEX is not due until November.  A: Postmenopausal on HRT  History of HTN - on medication  Plan: She is aware that Mammogram upcoming must be normal before HRT can be given  She is aware that if diagnosis changes that she does have A Fib we can not give her HRT as this is unsafe.  Will also follow with routine screening of labs  Consult time:  15 minutes face to face

## 2015-11-20 LAB — HIV ANTIBODY (ROUTINE TESTING W REFLEX): HIV 1&2 Ab, 4th Generation: NONREACTIVE

## 2015-11-20 LAB — HEPATITIS C ANTIBODY: HCV AB: NEGATIVE

## 2015-11-20 NOTE — Progress Notes (Signed)
Reviewed personally.  M. Suzanne Toba Claudio, MD.  

## 2015-11-29 NOTE — Telephone Encounter (Signed)
Yes I will close encounter

## 2015-11-29 NOTE — Telephone Encounter (Signed)
Okay to close this encounter? °

## 2015-12-02 DIAGNOSIS — Z0000 Encounter for general adult medical examination without abnormal findings: Secondary | ICD-10-CM | POA: Insufficient documentation

## 2015-12-02 DIAGNOSIS — I4891 Unspecified atrial fibrillation: Secondary | ICD-10-CM | POA: Insufficient documentation

## 2015-12-02 DIAGNOSIS — E871 Hypo-osmolality and hyponatremia: Secondary | ICD-10-CM | POA: Insufficient documentation

## 2016-01-21 ENCOUNTER — Encounter: Payer: Self-pay | Admitting: Nurse Practitioner

## 2016-03-23 ENCOUNTER — Ambulatory Visit (INDEPENDENT_AMBULATORY_CARE_PROVIDER_SITE_OTHER): Payer: BLUE CROSS/BLUE SHIELD | Admitting: Nurse Practitioner

## 2016-03-23 ENCOUNTER — Encounter: Payer: Self-pay | Admitting: Nurse Practitioner

## 2016-03-23 VITALS — BP 136/68 | HR 64 | Ht 63.75 in | Wt 209.0 lb

## 2016-03-23 DIAGNOSIS — Z79890 Hormone replacement therapy (postmenopausal): Secondary | ICD-10-CM | POA: Diagnosis not present

## 2016-03-23 DIAGNOSIS — Z01419 Encounter for gynecological examination (general) (routine) without abnormal findings: Secondary | ICD-10-CM

## 2016-03-23 DIAGNOSIS — E559 Vitamin D deficiency, unspecified: Secondary | ICD-10-CM | POA: Diagnosis not present

## 2016-03-23 DIAGNOSIS — N8501 Benign endometrial hyperplasia: Secondary | ICD-10-CM

## 2016-03-23 DIAGNOSIS — Z0000 Encounter for general adult medical examination without abnormal findings: Secondary | ICD-10-CM

## 2016-03-23 DIAGNOSIS — Z1211 Encounter for screening for malignant neoplasm of colon: Secondary | ICD-10-CM

## 2016-03-23 DIAGNOSIS — I10 Essential (primary) hypertension: Secondary | ICD-10-CM

## 2016-03-23 MED ORDER — ESTRADIOL 0.5 MG PO TABS: 0.5000 mg | ORAL_TABLET | Freq: Every day | ORAL | 4 refills | Status: DC

## 2016-03-23 NOTE — Progress Notes (Signed)
Patient ID: Meredith KanskyGeorgianna Gilbert, female   DOB: [birth year], 60 y.o.   MRN: [id number]  60 y.o. [id number] Married  Caucasian Fe here for annual exam.  PMH of complex endo hyperplasia with focal atypia in polyp and TAH/BSO on 12/11/14 by Dr. Andrey Farmerossi was benign.  A fib is now not an issue and cardiologist thinks the episode was only related to stress, caffeine and severe hypokalemia - diuretic induced.  Now off HCTZ.  He is aware that she is on ERT and did not feel this was a problem per pt.  Patient's last menstrual period was 02/25/2014 (approximate).          Sexually active: Yes.    The current method of family planning is status post hysterectomy - TAH/BSO    Exercising: Yes.    daily walking Smoker:  no  Health Maintenance: Pap:  09/2012, Negative (Hysterectomy 12/11/14 for endo hyperplasia) MMG:  12/09/15, 3D, Bi-Rads 2: Benign Findings Colonoscopy:  10/2010, polyp, repeat in 10 years, [facility] BMD:   2009, normal per patient TDaP: 05/13/07 Hep C and HIV: 11/19/15 Labs: Care Everywhere 11/11/15   reports that she has never smoked. She has never used smokeless tobacco. She reports that she drinks alcohol. She reports that she does not use drugs.  Past Medical History:  Diagnosis Date  . Carpal tunnel syndrome    bilateral remains an issue- Right rotator cuff issue. Left plantar fasciatis  . Complex endometrial hyperplasia 10/2014   TAH/ BSO was done, pathology negative for cancer  . Endometrial polyp   . Hypertension   . Plantar fasciitis   . PONV (postoperative nausea and vomiting)    "heart felt it was going to jump out of my chest" "grimaces teeth-mouth guard used"  . Sleep apnea, obstructive    cpap used(automatic 5-15)    Past Surgical History:  Procedure Laterality Date  . DILATION AND CURETTAGE OF UTERUS  2009, 2016   polyp removal- x3 total ('89)  . DILATION AND CURETTAGE, DIAGNOSTIC / THERAPEUTIC  1989  . ROBOTIC ASSISTED TOTAL HYSTERECTOMY WITH BILATERAL  SALPINGO OOPHERECTOMY Bilateral 12/11/2014   Procedure: ROBOTIC ASSISTED TOTAL HYSTERECTOMY WITH BILATERAL SALPINGO OOPHORECTOMY;  Surgeon: Adolphus BirchwoodEmma Rossi, MD;  Location: [facility];  Service: Gynecology;  Laterality: Bilateral;  . TONSILLECTOMY    . TUBAL LIGATION  1993    Current Outpatient Prescriptions  Medication Sig Dispense Refill  . Acetaminophen (TYLENOL PO) Take 2 tablets by mouth daily as needed (pain).    Marland Kitchen. b complex vitamins capsule Take 1 capsule by mouth daily.    . chlorhexidine (PERIDEX) 0.12 % solution mouth rinse  0  . Cholecalciferol (VITAMIN D3) 5000 UNITS CAPS Take 5,000 Units by mouth 3 (three) times daily.     Marland Kitchen. estradiol (ESTRACE) 0.5 MG tablet Take 0.5 mg by mouth daily.    Marland Kitchen. loratadine (CLARITIN) 10 MG tablet Take 10 mg by mouth daily as needed for allergies or itching.    . Menaquinone-7 (VITAMIN K2 PO) Take 150 mcg by mouth daily.    . NON FORMULARY Pregnenolone 75mg , one tablet daily    . Probiotic Product (PROBIOTIC DAILY PO) Take 1 capsule by mouth daily.    . progesterone (PROMETRIUM) 100 MG capsule Take 100 mg by mouth daily.    . Turmeric 500 MG TABS Take 1 tablet by mouth daily.     No current facility-administered medications for this visit.     Family History  Problem Relation Age of Onset  . Prostate  cancer Maternal Uncle   . Ovarian cancer Maternal Grandmother   . Prostate cancer Maternal Grandfather     dx. 6770s  . Lung cancer Maternal Uncle     smoker; nicotine gum  . Uterine cancer Cousin 61  . Stroke Mother   . Heart attack Father   . Other Sister     heavy bleeding and 13 exterior cysts on uterus; required TAH-BSO  . Stroke Paternal Grandmother   . Stroke Paternal Grandfather   . Kidney cancer Paternal Uncle [id number]    may have metastasized to lungs; smoker  . Autism Other     ROS:  Pertinent items are noted in HPI.  Otherwise, a comprehensive ROS was negative.  Exam:   LMP 02/25/2014 (Approximate)    Ht Readings from Last 3 Encounters:   11/19/15 5\' 4"  (1.626 m)  03/19/15 5\' 4"  (1.626 m)  12/11/14 5' 3.75" (1.619 m)    General appearance: alert, cooperative and appears stated age Head: Normocephalic, without obvious abnormality, atraumatic Neck: no adenopathy, supple, symmetrical, trachea midline and thyroid normal to inspection and palpation Lungs: clear to auscultation bilaterally Breasts: normal appearance, no masses or tenderness Heart: regular rate and rhythm Abdomen: soft, non-tender; no masses,  no organomegaly Extremities: extremities normal, atraumatic, no cyanosis or edema Skin: Skin color, texture, turgor normal. No rashes or lesions Lymph nodes: Cervical, supraclavicular, and axillary nodes normal. No abnormal inguinal nodes palpated Neurologic: Grossly normal   Pelvic: External genitalia:  no lesions              Urethra:  normal appearing urethra with no masses, tenderness or lesions              Bartholin's and Skene's: normal                 Vagina: normal appearing vagina with normal color and discharge, no lesions              Cervix: absent              Pap taken: Yes.   Bimanual Exam:  Uterus:  uterus absent              Adnexa: no mass, fullness, tenderness               Rectovaginal: Confirms               Anus:  normal sphincter tone, no lesions  Chaperone present: yes  A:  Well Woman with normal exam   History of complex endometrial hyperplasia             S/P TAH/ BSO 12/11/14 by Dr. Andrey Farmerossi             History of Vit D deficiency, HTN, sleep apnea, and A fib episode related to severe hypokalemia    P:   Reviewed health and wellness pertinent to exam  Pap smear is done - first pap after hysterectomy  Mammogram is due 8/18  Refilled Estrace at this time but she is aware will need to consult with Dr. Hyacinth MeekerMiller  Did not refill Prometrium as I am not sure she needs this now. - again will consult  Discussed risk of DVT, CVA, cancer, etc especially in view of her history  IFOB is  given  Counseled on breast self exam, mammography screening, use and side effects of HRT, adequate intake of calcium and vitamin D, diet and exercise return annually or prn  An After Visit Summary was  printed and given to the patient.

## 2016-03-23 NOTE — Patient Instructions (Addendum)

## 2016-03-24 DIAGNOSIS — Z1272 Encounter for screening for malignant neoplasm of vagina: Secondary | ICD-10-CM | POA: Diagnosis not present

## 2016-03-24 DIAGNOSIS — Z0000 Encounter for general adult medical examination without abnormal findings: Secondary | ICD-10-CM | POA: Diagnosis not present

## 2016-03-24 DIAGNOSIS — N8501 Benign endometrial hyperplasia: Secondary | ICD-10-CM | POA: Diagnosis not present

## 2016-03-26 ENCOUNTER — Telehealth: Payer: Self-pay | Admitting: Nurse Practitioner

## 2016-03-26 LAB — IPS PAP TEST WITH REFLEX TO HPV

## 2016-03-26 NOTE — Progress Notes (Signed)
Reviewed personally.  M. Suzanne Koray Soter, MD.  

## 2016-03-26 NOTE — Telephone Encounter (Signed)
Ria CommentPatricia Grubb, NP -see message below and advise on lab orders? Last AEX 03/23/16 -no labs recommended in plan notes.

## 2016-03-26 NOTE — Telephone Encounter (Signed)
Patient called and scheduled a lab appointment for "hormone levels." She said she and Patty should have scheduled this at her last visit. I don't see an order in so I am sending this to triage. Appointment scheduled 04/02/16.

## 2016-03-27 NOTE — Telephone Encounter (Signed)
Dr Hyacinth MeekerMiller please review:  It was not hormonal testing that needed to be done - but a clarification of HRT.

## 2016-03-30 LAB — FECAL OCCULT BLOOD, IMMUNOCHEMICAL: IMMUNOLOGICAL FECAL OCCULT BLOOD TEST: NEGATIVE

## 2016-03-30 NOTE — Addendum Note (Signed)
Addended by: Zenovia JordanMITCHELL, Nyzier Boivin A on: 03/30/2016 10:28 AM   Modules accepted: Orders

## 2016-03-31 ENCOUNTER — Telehealth: Payer: Self-pay

## 2016-03-31 ENCOUNTER — Encounter: Payer: Self-pay | Admitting: Nurse Practitioner

## 2016-03-31 NOTE — Telephone Encounter (Signed)
See email to pt

## 2016-03-31 NOTE — Telephone Encounter (Signed)
Telephone encounter created to review with Patricia Grubb, FNP. 

## 2016-03-31 NOTE — Telephone Encounter (Signed)
I [facility] for me to get a better understanding of our concerns - no further information is given.  She is sent an e-mail with our recommendations.

## 2016-03-31 NOTE — Telephone Encounter (Signed)
Patient is scheduled for lab appt 12/7. No lab orders at this time.  Dr. Hyacinth MeekerMiller -please review and advise?  Cc: Ria CommentPatricia Grubb, NP

## 2016-03-31 NOTE — Telephone Encounter (Signed)
Ria CommentPatricia Grubb, NP ok to close this encounter?

## 2016-03-31 NOTE — Telephone Encounter (Signed)
Non-Urgent Medical Question  Message [phone]  From Meredith KanskyGeorgianna Gilbert To Ria CommentPatricia Grubb, FNP Sent 03/31/2016 10:12 AM  Elease HashimotoPatricia,   I am coming in for the hormone level blood test on Thursday. My Estradiol and Progesterone are ending this week.    As I am not feeling any significant change with the hormones, I am thinking I will not continue. You mentioned visiting with Dr. Hyacinth MeekerMiller regarding the progesterone. Do I have to worry about weaning off of that hormone or can I simply stop taking it? Dr. Andrey Farmerossi had told me no progesterone, yet I [facility]. Now, I worry about side effects to taking that besides the blood clot issues. (I do know my blood coagulation numbers are in the normal range. I had that done when I visited the hospital in July for the heart issue.)   I am hoping the hormone level test will tell me if there is any significant change to the positive with the Estrodial. What should I be considering as benefits or consequences for a longer term use of the Estrodial?    You can answer more once the blood work is done.  Thank you!   Meredith CarpenterGigi Gilbert   Responsible Party   Pool - [facility] Clinical Pool No one has taken responsibility for this message.  No actions have been taken on this message.   Routing to Ria CommentPatricia Grubb, FNP for review and advise.

## 2016-03-31 NOTE — Telephone Encounter (Signed)
OK to close.  Not sure if she decided on recheck of labs -so those are not canceled.

## 2016-04-01 NOTE — Telephone Encounter (Signed)
Left message to call Meredith Mells at [phone].  Returned call to patient to verify if she was continuing with 12/7 lab appt.

## 2016-04-02 ENCOUNTER — Other Ambulatory Visit (INDEPENDENT_AMBULATORY_CARE_PROVIDER_SITE_OTHER): Payer: BLUE CROSS/BLUE SHIELD

## 2016-04-02 DIAGNOSIS — Z0000 Encounter for general adult medical examination without abnormal findings: Secondary | ICD-10-CM

## 2016-04-02 DIAGNOSIS — E349 Endocrine disorder, unspecified: Secondary | ICD-10-CM

## 2016-04-03 DIAGNOSIS — E349 Endocrine disorder, unspecified: Secondary | ICD-10-CM | POA: Diagnosis not present

## 2016-04-04 LAB — ESTRADIOL: Estradiol: 33 pg/mL

## 2016-04-04 LAB — PROGESTERONE: Progesterone: <0.5 ng/mL

## 2016-04-05 ENCOUNTER — Other Ambulatory Visit: Payer: Self-pay | Admitting: Nurse Practitioner

## 2016-04-05 ENCOUNTER — Encounter: Payer: Self-pay | Admitting: Nurse Practitioner

## 2016-04-07 NOTE — Telephone Encounter (Signed)
Patient was seen in the office on 04/03/2016 for lab appointment. Please see result note.  Routing to provider for final review. Patient agreeable to disposition. Will close encounter.

## 2016-04-13 ENCOUNTER — Encounter: Payer: Self-pay | Admitting: *Deleted

## 2016-05-27 DIAGNOSIS — M9904 Segmental and somatic dysfunction of sacral region: Secondary | ICD-10-CM | POA: Diagnosis not present

## 2016-05-27 DIAGNOSIS — M9905 Segmental and somatic dysfunction of pelvic region: Secondary | ICD-10-CM | POA: Diagnosis not present

## 2016-05-27 DIAGNOSIS — M9902 Segmental and somatic dysfunction of thoracic region: Secondary | ICD-10-CM | POA: Diagnosis not present

## 2016-05-27 DIAGNOSIS — M9903 Segmental and somatic dysfunction of lumbar region: Secondary | ICD-10-CM | POA: Diagnosis not present

## 2016-05-29 DIAGNOSIS — M9905 Segmental and somatic dysfunction of pelvic region: Secondary | ICD-10-CM | POA: Diagnosis not present

## 2016-05-29 DIAGNOSIS — M9903 Segmental and somatic dysfunction of lumbar region: Secondary | ICD-10-CM | POA: Diagnosis not present

## 2016-05-29 DIAGNOSIS — M9902 Segmental and somatic dysfunction of thoracic region: Secondary | ICD-10-CM | POA: Diagnosis not present

## 2016-05-29 DIAGNOSIS — M9904 Segmental and somatic dysfunction of sacral region: Secondary | ICD-10-CM | POA: Diagnosis not present

## 2016-06-08 DIAGNOSIS — M9904 Segmental and somatic dysfunction of sacral region: Secondary | ICD-10-CM | POA: Diagnosis not present

## 2016-06-08 DIAGNOSIS — M9903 Segmental and somatic dysfunction of lumbar region: Secondary | ICD-10-CM | POA: Diagnosis not present

## 2016-06-08 DIAGNOSIS — M9905 Segmental and somatic dysfunction of pelvic region: Secondary | ICD-10-CM | POA: Diagnosis not present

## 2016-06-08 DIAGNOSIS — M9902 Segmental and somatic dysfunction of thoracic region: Secondary | ICD-10-CM | POA: Diagnosis not present

## 2016-07-13 DIAGNOSIS — M9903 Segmental and somatic dysfunction of lumbar region: Secondary | ICD-10-CM | POA: Diagnosis not present

## 2016-07-13 DIAGNOSIS — M9905 Segmental and somatic dysfunction of pelvic region: Secondary | ICD-10-CM | POA: Diagnosis not present

## 2016-07-13 DIAGNOSIS — M9902 Segmental and somatic dysfunction of thoracic region: Secondary | ICD-10-CM | POA: Diagnosis not present

## 2016-07-13 DIAGNOSIS — M9904 Segmental and somatic dysfunction of sacral region: Secondary | ICD-10-CM | POA: Diagnosis not present

## 2016-08-03 DIAGNOSIS — M9903 Segmental and somatic dysfunction of lumbar region: Secondary | ICD-10-CM | POA: Diagnosis not present

## 2016-08-03 DIAGNOSIS — M9902 Segmental and somatic dysfunction of thoracic region: Secondary | ICD-10-CM | POA: Diagnosis not present

## 2016-08-03 DIAGNOSIS — M9905 Segmental and somatic dysfunction of pelvic region: Secondary | ICD-10-CM | POA: Diagnosis not present

## 2016-08-03 DIAGNOSIS — M9904 Segmental and somatic dysfunction of sacral region: Secondary | ICD-10-CM | POA: Diagnosis not present

## 2016-09-07 DIAGNOSIS — H2513 Age-related nuclear cataract, bilateral: Secondary | ICD-10-CM | POA: Diagnosis not present

## 2016-09-07 DIAGNOSIS — H43393 Other vitreous opacities, bilateral: Secondary | ICD-10-CM | POA: Diagnosis not present

## 2016-09-07 DIAGNOSIS — H524 Presbyopia: Secondary | ICD-10-CM | POA: Diagnosis not present

## 2016-09-07 DIAGNOSIS — H52203 Unspecified astigmatism, bilateral: Secondary | ICD-10-CM | POA: Diagnosis not present

## 2016-09-07 DIAGNOSIS — H019 Unspecified inflammation of eyelid: Secondary | ICD-10-CM | POA: Diagnosis not present

## 2016-11-17 ENCOUNTER — Telehealth: Payer: Self-pay | Admitting: Obstetrics & Gynecology

## 2016-11-17 NOTE — Telephone Encounter (Signed)
LMTCB/:NP/ .CX/LETTER SENT/RD ° °

## 2017-01-06 DIAGNOSIS — Z1231 Encounter for screening mammogram for malignant neoplasm of breast: Secondary | ICD-10-CM | POA: Diagnosis not present

## 2017-02-05 DIAGNOSIS — M9905 Segmental and somatic dysfunction of pelvic region: Secondary | ICD-10-CM | POA: Diagnosis not present

## 2017-02-05 DIAGNOSIS — M9904 Segmental and somatic dysfunction of sacral region: Secondary | ICD-10-CM | POA: Diagnosis not present

## 2017-02-05 DIAGNOSIS — M9902 Segmental and somatic dysfunction of thoracic region: Secondary | ICD-10-CM | POA: Diagnosis not present

## 2017-02-05 DIAGNOSIS — M9903 Segmental and somatic dysfunction of lumbar region: Secondary | ICD-10-CM | POA: Diagnosis not present

## 2017-03-03 DIAGNOSIS — J309 Allergic rhinitis, unspecified: Secondary | ICD-10-CM | POA: Diagnosis not present

## 2017-03-03 DIAGNOSIS — H608X3 Other otitis externa, bilateral: Secondary | ICD-10-CM | POA: Diagnosis not present

## 2017-03-03 DIAGNOSIS — J370 Chronic laryngitis: Secondary | ICD-10-CM | POA: Insufficient documentation

## 2017-03-03 DIAGNOSIS — H9311 Tinnitus, right ear: Secondary | ICD-10-CM | POA: Diagnosis not present

## 2017-03-03 DIAGNOSIS — R053 Chronic cough: Secondary | ICD-10-CM | POA: Insufficient documentation

## 2017-03-05 DIAGNOSIS — M9902 Segmental and somatic dysfunction of thoracic region: Secondary | ICD-10-CM | POA: Diagnosis not present

## 2017-03-05 DIAGNOSIS — M9905 Segmental and somatic dysfunction of pelvic region: Secondary | ICD-10-CM | POA: Diagnosis not present

## 2017-03-05 DIAGNOSIS — M9904 Segmental and somatic dysfunction of sacral region: Secondary | ICD-10-CM | POA: Diagnosis not present

## 2017-03-05 DIAGNOSIS — M9903 Segmental and somatic dysfunction of lumbar region: Secondary | ICD-10-CM | POA: Diagnosis not present

## 2017-03-25 ENCOUNTER — Ambulatory Visit (INDEPENDENT_AMBULATORY_CARE_PROVIDER_SITE_OTHER): Payer: BLUE CROSS/BLUE SHIELD | Admitting: Certified Nurse Midwife

## 2017-03-25 ENCOUNTER — Other Ambulatory Visit: Payer: Self-pay

## 2017-03-25 ENCOUNTER — Encounter: Payer: Self-pay | Admitting: Certified Nurse Midwife

## 2017-03-25 VITALS — BP 124/64 | HR 70 | Resp 16 | Ht 63.5 in | Wt 214.0 lb

## 2017-03-25 DIAGNOSIS — Z01419 Encounter for gynecological examination (general) (routine) without abnormal findings: Secondary | ICD-10-CM

## 2017-03-25 DIAGNOSIS — Z8742 Personal history of other diseases of the female genital tract: Secondary | ICD-10-CM

## 2017-03-25 DIAGNOSIS — Z23 Encounter for immunization: Secondary | ICD-10-CM

## 2017-03-25 DIAGNOSIS — Z780 Asymptomatic menopausal state: Secondary | ICD-10-CM

## 2017-03-25 NOTE — Progress Notes (Signed)
61 y.o. [id number] Married  Caucasian Fe here for annual exam.Menopausal no HRT, denies vaginal bleeding. Sees Dr. Lita MainsHaines PCP yearly for medication management of hypertension/ allergies and labs. Social stress with caring for mother with health issues. Spouse has atrial fib diagnosis recently [facility] and brother recently with heart issues. "I am trying to stay well for them". Exercise in the form of walking, for health and stress relief daily. Eating well, no health issues today. Planning a good Christmas holiday this year. Has family support as needed.  Patient's last menstrual period was 02/25/2014 (approximate).          Sexually active: Yes.    The current method of family planning is status post hysterectomy.    Exercising: Yes.    walking Smoker:  no  Health Maintenance: Pap:  6/14 neg, 03-24-16 neg History of Abnormal Pap: endo hyperplasia MMG:  2018 neg per patient Self Breast exams: yes Colonoscopy:  2012 polyp f/u 1141yrs BMD:   2009 normal TDaP:  04/2007  Shingles: no Pneumonia: no Hep C and HIV: 2017 both neg Labs: with PCP   reports that  has never smoked. she has never used smokeless tobacco. She reports that she drinks alcohol. She reports that she does not use drugs.  Past Medical History:  Diagnosis Date  . Carpal tunnel syndrome    bilateral remains an issue- Right rotator cuff issue. Left plantar fasciatis  . Complex endometrial hyperplasia 10/2014   TAH/ BSO was done, pathology negative for cancer  . Endometrial polyp   . Hypertension   . Plantar fasciitis   . PONV (postoperative nausea and vomiting)    "heart felt it was going to jump out of my chest" "grimaces teeth-mouth guard used"  . Sleep apnea, obstructive    cpap used(automatic 5-15)    Past Surgical History:  Procedure Laterality Date  . DILATION AND CURETTAGE OF UTERUS  2009, 2016   polyp removal- x3 total ('89)  . DILATION AND CURETTAGE, DIAGNOSTIC / THERAPEUTIC  1989  . ROBOTIC  ASSISTED TOTAL HYSTERECTOMY WITH BILATERAL SALPINGO OOPHERECTOMY Bilateral 12/11/2014   Procedure: ROBOTIC ASSISTED TOTAL HYSTERECTOMY WITH BILATERAL SALPINGO OOPHORECTOMY;  Surgeon: Adolphus BirchwoodEmma Rossi, MD;  Location: [facility];  Service: Gynecology;  Laterality: Bilateral;  . TONSILLECTOMY    . TUBAL LIGATION  1993    Current Outpatient Medications  Medication Sig Dispense Refill  . Acetaminophen (TYLENOL PO) Take 2 tablets by mouth daily as needed (pain).    . chlorhexidine (PERIDEX) 0.12 % solution mouth rinse  0  . loratadine (CLARITIN) 10 MG tablet Take 10 mg by mouth daily as needed for allergies or itching.    . Omega 3-6-9 Fatty Acids (OMEGA 3-6-9 COMPLEX PO) Take 1 tablet by mouth daily.     No current facility-administered medications for this visit.     Family History  Problem Relation Age of Onset  . Prostate cancer Maternal Uncle   . Ovarian cancer Maternal Grandmother   . Prostate cancer Maternal Grandfather        dx. 2770s  . Lung cancer Maternal Uncle        smoker; nicotine gum  . Uterine cancer Cousin 61  . Stroke Mother   . Heart attack Father   . Other Sister        heavy bleeding and 13 exterior cysts on uterus; required TAH-BSO  . Stroke Paternal Grandmother   . Stroke Paternal Grandfather   . Kidney cancer Paternal Uncle [id number]  may have metastasized to lungs; smoker  . Autism Other     ROS:  Pertinent items are noted in HPI.  Otherwise, a comprehensive ROS was negative.  Exam:   LMP 02/25/2014 (Approximate)    Ht Readings from Last 3 Encounters:  03/23/16 5' 3.75" (1.619 m)  11/19/15 5\' 4"  (1.626 m)  03/19/15 5\' 4"  (1.626 m)    General appearance: alert, cooperative and appears stated age Head: Normocephalic, without obvious abnormality, atraumatic Neck: no adenopathy, supple, symmetrical, trachea midline and thyroid normal to inspection and palpation Lungs: clear to auscultation bilaterally Breasts: normal appearance, no masses or tenderness, No nipple  retraction or dimpling, No nipple discharge or bleeding, No axillary or supraclavicular adenopathy Heart: regular rate and rhythm Abdomen: soft, non-tender; no masses,  no organomegaly Extremities: extremities normal, atraumatic, no cyanosis or edema Skin: Skin color, texture, turgor normal. No rashes or lesions Lymph nodes: Cervical, supraclavicular, and axillary nodes normal. No abnormal inguinal nodes palpated Neurologic: Grossly normal   Pelvic: External genitalia:  no lesions              Urethra:  normal appearing urethra with no masses, tenderness or lesions              Bartholin's and Skene's: normal                 Vagina: normal appearing vagina with normal color and discharge, no lesions              Cervix: absent              Pap taken: No. Bimanual Exam:  Uterus:  uterus absent              Adnexa: no mass, fullness, tenderness and surgically absent               Rectovaginal: Confirms               Anus:  normal sphincter tone, no lesions  Chaperone present: yes  A:  Well Woman with normal exam  Menopausal no HRT S/P TAH with BSO for endometrial hyperplasia, pathology negative.  Vaginal dryness uses coconut oil with good response  Hypertension with PCP management  Immunization update  Social stress with caring for family  BMD due  P:   Reviewed health and wellness pertinent to exam.  Discussed continued use of coconut oil to provide moisture and decrease risk of UTI and vaginal infections. Questions addressed.  Continue follow up with MD as indicated.  Request TDAP  Seek friends and family support as needed for stress relief.  Patient will call to schedule  Pap smear: no   counseled on breast self exam, mammography screening, adequate intake of calcium and vitamin D, diet and exercise  return annually or prn  An After Visit Summary was printed and given to the patient.

## 2017-03-25 NOTE — Patient Instructions (Signed)

## 2017-03-29 ENCOUNTER — Telehealth: Payer: Self-pay | Admitting: Certified Nurse Midwife

## 2017-03-29 NOTE — Telephone Encounter (Signed)
Spoke with patient. Last BMD 2009, [facility]. Advised patient Meredith SauersDeborah Gilbert, CNM is out of the office today, will review once she returns on 12/4 and return call once order has been faxed. Patient is agreeable.   Written order placed on Meredith Sauerseborah Gilbert, CNM desk for signature.

## 2017-03-29 NOTE — Telephone Encounter (Signed)
Patient requesting a order for bone density scan be sent to [facility] fax#[phone]

## 2017-03-30 ENCOUNTER — Ambulatory Visit: Payer: BLUE CROSS/BLUE SHIELD | Admitting: Nurse Practitioner

## 2017-03-30 NOTE — Telephone Encounter (Signed)
Order for BMD faxed to [facility].   Left detailed message for patient, ok per current dpr. Advised order faxed for BMD as requested, f/[facility] with [facility] for scheduling, return call to office with any additional questions.  Routing to provider for final review. Patient is agreeable to disposition. Will close encounter.

## 2017-04-09 DIAGNOSIS — Z13820 Encounter for screening for osteoporosis: Secondary | ICD-10-CM | POA: Diagnosis not present

## 2017-04-09 DIAGNOSIS — Z780 Asymptomatic menopausal state: Secondary | ICD-10-CM | POA: Diagnosis not present

## 2017-04-09 DIAGNOSIS — M810 Age-related osteoporosis without current pathological fracture: Secondary | ICD-10-CM | POA: Diagnosis not present

## 2017-04-12 ENCOUNTER — Telehealth: Payer: Self-pay

## 2017-04-12 NOTE — Telephone Encounter (Signed)
Pt notified of bmd results. See scanned in bmd

## 2017-04-29 DIAGNOSIS — M9903 Segmental and somatic dysfunction of lumbar region: Secondary | ICD-10-CM | POA: Diagnosis not present

## 2017-04-29 DIAGNOSIS — M9902 Segmental and somatic dysfunction of thoracic region: Secondary | ICD-10-CM | POA: Diagnosis not present

## 2017-04-29 DIAGNOSIS — M9905 Segmental and somatic dysfunction of pelvic region: Secondary | ICD-10-CM | POA: Diagnosis not present

## 2017-04-29 DIAGNOSIS — M9904 Segmental and somatic dysfunction of sacral region: Secondary | ICD-10-CM | POA: Diagnosis not present

## 2017-06-02 ENCOUNTER — Encounter: Payer: Self-pay | Admitting: Certified Nurse Midwife

## 2017-06-03 ENCOUNTER — Telehealth: Payer: Self-pay | Admitting: Certified Nurse Midwife

## 2017-06-03 NOTE — Telephone Encounter (Signed)
Bone Density  06/03/2017 9:22 AM    To: Meredith KanskyGeorgianna Gilbert "Meredith ManlyGi Gi"    From: Ginny ForthSprague, Jaime Dome E, RN    Created: 06/03/2017 9:22 AM     [name],  Our office did scan your Bone Density into your chart on 05/24/2017. It is common to get a notification when we scan information into your record. This will become apart of your medical record in our system so we will always have it on file for comparison or review as needed. MyChart does notify you when we add something to your chart, but the imaging itself may not be available for you to actually access. If you would like a copy I can send a copy to your home address. Please let me know if you would like this mailed.  Sincerely,  Nolen MuKaitlyn Prinston Kynard, RN     Routing to provider for final review. Patient agreeable to disposition. Will close encounter.

## 2017-06-03 NOTE — Telephone Encounter (Signed)
Message   ----- Message from Mychart, Generic sent at 06/02/2017 10:05 PM EST -----    There was a notice for an update to medical records. I am assuming it was for the Bone Density Scan. The update did not get saved to my profile.    Let me know when it does. Thank you!  Sincerely,  Rosaura CarpenterGigi Perlman

## 2017-09-14 DIAGNOSIS — H2513 Age-related nuclear cataract, bilateral: Secondary | ICD-10-CM | POA: Insufficient documentation

## 2018-04-14 ENCOUNTER — Other Ambulatory Visit: Payer: Self-pay

## 2018-04-14 ENCOUNTER — Encounter: Payer: Self-pay | Admitting: Certified Nurse Midwife

## 2018-04-14 ENCOUNTER — Ambulatory Visit: Payer: BLUE CROSS/BLUE SHIELD | Admitting: Certified Nurse Midwife

## 2018-04-14 VITALS — BP 140/70 | HR 72 | Ht 63.5 in | Wt 217.0 lb

## 2018-04-14 DIAGNOSIS — Z8679 Personal history of other diseases of the circulatory system: Secondary | ICD-10-CM | POA: Diagnosis not present

## 2018-04-14 DIAGNOSIS — N951 Menopausal and female climacteric states: Secondary | ICD-10-CM

## 2018-04-14 DIAGNOSIS — Z659 Problem related to unspecified psychosocial circumstances: Secondary | ICD-10-CM | POA: Diagnosis not present

## 2018-04-14 DIAGNOSIS — Z01419 Encounter for gynecological examination (general) (routine) without abnormal findings: Secondary | ICD-10-CM | POA: Diagnosis not present

## 2018-04-14 NOTE — Progress Notes (Signed)
62 y.o. [id number] Married  Caucasian Fe here for annual exam.  Menopausal with occasional hot flash,no concerns. Denies vaginal dryness issues. Has had stressful issues [place] to [place] and now close to her family. Spouse injured in MVA, but recovering. Continues to see PCP Dr.Haines for hypertension and labs. Patient felt she had mammogram and no report in. Will sign for request of copy. No other health issues today.  Patient's last menstrual period was 02/25/2014 (approximate).          Sexually active: Yes.    The current method of family planning is status post hysterectomy.    Exercising: Yes.    walking Smoker:  no  Review of Systems  Constitutional: Negative.   HENT: Negative.   Eyes: Negative.   Respiratory: Negative.   Cardiovascular: Negative.   Gastrointestinal: Negative.   Genitourinary: Negative.   Musculoskeletal: Negative.   Skin: Negative.   Neurological: Negative.   Endo/Heme/Allergies: Negative.   Psychiatric/Behavioral: Negative.     Health Maintenance: Pap:  03-24-16 neg History of Abnormal Pap: no MMG:  12-09-15 category b density birads 2:neg Self Breast exams: yes Colonoscopy:  2012 polyp f/u 4020yrs BMD:   2018 TDaP:  2018 Shingles: no Pneumonia: no Hep C and HIV: both neg 2017 Labs: if  needed   reports that she has never smoked. She has never used smokeless tobacco. She reports current alcohol use. She reports that she does not use drugs.  Past Medical History:  Diagnosis Date  . Carpal tunnel syndrome    bilateral remains an issue- Right rotator cuff issue. Left plantar fasciatis  . Complex endometrial hyperplasia 10/2014   TAH/ BSO was done, pathology negative for cancer  . Endometrial polyp   . Hypertension   . Plantar fasciitis   . PONV (postoperative nausea and vomiting)    "heart felt it was going to jump out of my chest" "grimaces teeth-mouth guard used"  . Sleep apnea, obstructive    cpap used(automatic 5-15)    Past  Surgical History:  Procedure Laterality Date  . DILATION AND CURETTAGE OF UTERUS  2009, 2016   polyp removal- x3 total ('89)  . DILATION AND CURETTAGE, DIAGNOSTIC / THERAPEUTIC  1989  . ROBOTIC ASSISTED TOTAL HYSTERECTOMY WITH BILATERAL SALPINGO OOPHERECTOMY Bilateral 12/11/2014   Procedure: ROBOTIC ASSISTED TOTAL HYSTERECTOMY WITH BILATERAL SALPINGO OOPHORECTOMY;  Surgeon: Adolphus BirchwoodEmma Rossi, MD;  Location: [facility];  Service: Gynecology;  Laterality: Bilateral;  . TONSILLECTOMY    . TUBAL LIGATION  1993    Current Outpatient Medications  Medication Sig Dispense Refill  . Acetaminophen (TYLENOL PO) Take 2 tablets by mouth daily as needed (pain).    . ASPIRIN 81 PO Take by mouth.    . chlorhexidine (PERIDEX) 0.12 % solution as needed.   0  . losartan (COZAAR) 50 MG tablet Take 50 mg by mouth daily.     No current facility-administered medications for this visit.     Family History  Problem Relation Age of Onset  . Prostate cancer Maternal Uncle   . Ovarian cancer Maternal Grandmother   . Prostate cancer Maternal Grandfather        dx. 6070s  . Lung cancer Maternal Uncle        smoker; nicotine gum  . Uterine cancer Cousin 61  . Stroke Mother   . Heart attack Father   . Other Sister        heavy bleeding and 13 exterior cysts on uterus; required TAH-BSO  . Stroke  Paternal Grandmother   . Stroke Paternal Grandfather   . Kidney cancer Paternal Uncle [id number]       may have metastasized to lungs; smoker  . Autism Other     ROS:  Pertinent items are noted in HPI.  Otherwise, a comprehensive ROS was negative.  Exam:   BP 140/70   Pulse 72   Ht 5' 3.5" (1.613 m)   Wt 217 lb (98.4 kg)   LMP 02/25/2014 (Approximate)   BMI 37.84 kg/m  Height: 5' 3.5" (161.3 cm) Ht Readings from Last 3 Encounters:  04/14/18 5' 3.5" (1.613 m)  03/25/17 5' 3.5" (1.613 m)  03/23/16 5' 3.75" (1.619 m)    General appearance: alert, cooperative and appears stated age Head: Normocephalic, without obvious  abnormality, atraumatic Neck: no adenopathy, supple, symmetrical, trachea midline and thyroid normal to inspection and palpation Lungs: clear to auscultation bilaterally Breasts: normal appearance, no masses or tenderness, No nipple retraction or dimpling, No nipple discharge or bleeding, No axillary or supraclavicular adenopathy Heart: regular rate and rhythm Abdomen: soft, non-tender; no masses,  no organomegaly Extremities: extremities normal, atraumatic, no cyanosis or edema Skin: Skin color, texture, turgor normal. No rashes or lesions Lymph nodes: Cervical, supraclavicular, and axillary nodes normal. No abnormal inguinal nodes palpated Neurologic: Grossly normal   Pelvic: External genitalia:  no lesions              Urethra:  normal appearing urethra with no masses, tenderness or lesions              Bartholin's and Skene's: normal                 Vagina: normal appearing vagina with normal color and discharge, no lesions              Cervix: absent              Pap taken: No. Bimanual Exam:  Uterus:  uterus absent              Adnexa: no mass, fullness, tenderness and adnexa surgically absent bilateral               Rectovaginal: Confirms               Anus:  normal sphincter tone, no lesions  Chaperone present: yes  A:  Well Woman with normal exam  Menopausal no HRT S/P TAH with BSO for endometrial hyperplasia  Hypertension with PCP management  Mammogram done ?  No report in   Social stress with aging family  P:   Reviewed health and wellness pertinent to exam  Aware if vaginal bleeding needs to advise even though she has had TAH  Continue follow up with PCP as indicated  Will sign for records  Stressed seeking other family and friend support  Pap smear: no  counseled on breast self exam, mammography screening, feminine hygiene, adequate intake of calcium and vitamin D, diet and exercise return annually or prn  An After Visit Summary was printed and given to the  patient.

## 2018-04-14 NOTE — Patient Instructions (Signed)

## 2018-06-27 DIAGNOSIS — Z8249 Family history of ischemic heart disease and other diseases of the circulatory system: Secondary | ICD-10-CM | POA: Insufficient documentation

## 2018-11-02 DIAGNOSIS — R002 Palpitations: Secondary | ICD-10-CM | POA: Insufficient documentation

## 2019-01-16 DIAGNOSIS — R7989 Other specified abnormal findings of blood chemistry: Secondary | ICD-10-CM | POA: Insufficient documentation

## 2019-05-01 ENCOUNTER — Other Ambulatory Visit: Payer: Self-pay

## 2019-05-02 ENCOUNTER — Ambulatory Visit (INDEPENDENT_AMBULATORY_CARE_PROVIDER_SITE_OTHER): Payer: BC Managed Care – PPO | Admitting: Certified Nurse Midwife

## 2019-05-02 ENCOUNTER — Encounter: Payer: Self-pay | Admitting: Certified Nurse Midwife

## 2019-05-02 ENCOUNTER — Other Ambulatory Visit: Payer: Self-pay

## 2019-05-02 VITALS — BP 112/64 | HR 70 | Temp 97.3°F | Resp 16 | Ht 63.75 in | Wt 212.0 lb

## 2019-05-02 DIAGNOSIS — Z01419 Encounter for gynecological examination (general) (routine) without abnormal findings: Secondary | ICD-10-CM | POA: Diagnosis not present

## 2019-05-02 NOTE — Progress Notes (Signed)
64 y.o. [id number] Married  Caucasian Fe here for annual exam. Post menopausal no HRT. Denies vaginal dryness. Sees Dr. Vista Lawman as PCP for labs and aex and Hypertension/Prilosec management. All stable per patient. Mother in retirement living now. No other health issues today.  Patient's last menstrual period was 02/25/2014 (approximate).          Sexually active: Yes.    The current method of family planning is status post hysterectomy.    Exercising: Yes.    walking Smoker:  no  Review of Systems  Constitutional: Negative.   HENT: Negative.   Eyes: Negative.   Respiratory: Negative.   Cardiovascular: Negative.   Gastrointestinal: Negative.   Genitourinary: Negative.   Musculoskeletal: Negative.   Skin: Negative.   Neurological: Negative.   Endo/Heme/Allergies: Negative.   Psychiatric/Behavioral: Negative.     Health Maintenance: Pap:  03-24-16 neg History of Abnormal Pap: no MMG:  10-26-2018 category b density birads 1:neg (care everywhere) Self Breast exams: yes Colonoscopy: 2012 polyp f/u 22yrs BMD:   2018 TDaP:  2018 Shingles: no Pneumonia: no Hep C and HIV: both neg 2017 Labs: if needed   reports that she has never smoked. She has never used smokeless tobacco. She reports current alcohol use. She reports that she does not use drugs.  Past Medical History:  Diagnosis Date  . Carpal tunnel syndrome    bilateral remains an issue- Right rotator cuff issue. Left plantar fasciatis  . Complex endometrial hyperplasia 10/2014   TAH/ BSO was done, pathology negative for cancer  . Endometrial polyp   . Hypertension   . Plantar fasciitis   . PONV (postoperative nausea and vomiting)    "heart felt it was going to jump out of my chest" "grimaces teeth-mouth guard used"  . Sleep apnea, obstructive    cpap used(automatic 5-15)    Past Surgical History:  Procedure Laterality Date  . DILATION AND CURETTAGE OF UTERUS  2009, 2016   polyp removal- x3 total ('89)  . DILATION AND  CURETTAGE, DIAGNOSTIC / THERAPEUTIC  1989  . ROBOTIC ASSISTED TOTAL HYSTERECTOMY WITH BILATERAL SALPINGO OOPHERECTOMY Bilateral 12/11/2014   Procedure: ROBOTIC ASSISTED TOTAL HYSTERECTOMY WITH BILATERAL SALPINGO OOPHORECTOMY;  Surgeon: Everitt Amber, MD;  Location: [facility];  Service: Gynecology;  Laterality: Bilateral;  . TONSILLECTOMY    . TUBAL LIGATION  1993    Current Outpatient Medications  Medication Sig Dispense Refill  . Acetaminophen (TYLENOL PO) Take 2 tablets by mouth daily as needed (pain).    . ASPIRIN 81 PO Take by mouth.    . chlorhexidine (PERIDEX) 0.12 % solution as needed.   0  . losartan (COZAAR) 50 MG tablet Take 50 mg by mouth daily.     No current facility-administered medications for this visit.    Family History  Problem Relation Age of Onset  . Prostate cancer Maternal Uncle   . Ovarian cancer Maternal Grandmother   . Prostate cancer Maternal Grandfather        dx. 62s  . Lung cancer Maternal Uncle        smoker; nicotine gum  . Uterine cancer Cousin 36  . Stroke Mother   . Heart attack Father   . Other Sister        heavy bleeding and 13 exterior cysts on uterus; required TAH-BSO  . Stroke Paternal Grandmother   . Stroke Paternal Grandfather   . Kidney cancer Paternal Uncle 28       may have metastasized to lungs; smoker  .  Autism Other     ROS:  Pertinent items are noted in HPI.  Otherwise, a comprehensive ROS was negative.  Exam:   LMP 02/25/2014 (Approximate)    Ht Readings from Last 3 Encounters:  04/14/18 5' 3.5" (1.613 m)  03/25/17 5' 3.5" (1.613 m)  03/23/16 5' 3.75" (1.619 m)    General appearance: alert, cooperative and appears stated age Head: Normocephalic, without obvious abnormality, atraumatic Neck: no adenopathy, supple, symmetrical, trachea midline and thyroid normal to inspection and palpation Lungs: clear to auscultation bilaterally Breasts: normal appearance, no masses or tenderness, No nipple retraction or dimpling, No nipple  discharge or bleeding, No axillary or supraclavicular adenopathy, slightly pendulous Heart: regular rate and rhythm Abdomen: soft, non-tender; no masses,  no organomegaly Extremities: extremities normal, atraumatic, no cyanosis or edema Skin: Skin color, texture, turgor normal. No rashes or lesions Lymph nodes: Cervical, supraclavicular, and axillary nodes normal. No abnormal inguinal nodes palpated Neurologic: Grossly normal   Pelvic: External genitalia:  no lesions              Urethra:  normal appearing urethra with no masses, tenderness or lesions              Bartholin's and Skene's: normal                 Vagina: normal appearing vagina with normal color and discharge, no lesions              Cervix: absent              Pap taken: No. Bimanual Exam:  Uterus:  uterus absent              Adnexa: no mass, fullness, tenderness and adnexa surgically absent bilateral               Rectovaginal: Confirms               Anus:  normal sphincter tone, no lesions  Chaperone present: yes  A:  Well Woman with normal exam  Post menopausal no HRT  Vaginal dryness using coconut oil with good response  Silent reflux, hypertension with MD management  BMD due this year  P:   Reviewed health and wellness pertinent to exam  Aware of need to advise if vaginal bleeding  Continue coconut oil as needed  Continue follow up with MD as indicated  Patient will call to have order sent when her mammogram is due for BMD  Pap smear: no   counseled on breast self exam, mammography screening, feminine hygiene, adequate intake of calcium and vitamin D, diet and exercise, Kegel's exercises  return annually or prn  An After Visit Summary was printed and given to the patient.

## 2019-05-02 NOTE — Patient Instructions (Signed)

## 2019-06-05 DIAGNOSIS — M9904 Segmental and somatic dysfunction of sacral region: Secondary | ICD-10-CM | POA: Diagnosis not present

## 2019-06-05 DIAGNOSIS — M9903 Segmental and somatic dysfunction of lumbar region: Secondary | ICD-10-CM | POA: Diagnosis not present

## 2019-06-05 DIAGNOSIS — M9902 Segmental and somatic dysfunction of thoracic region: Secondary | ICD-10-CM | POA: Diagnosis not present

## 2019-06-05 DIAGNOSIS — M9905 Segmental and somatic dysfunction of pelvic region: Secondary | ICD-10-CM | POA: Diagnosis not present

## 2019-06-28 DIAGNOSIS — M9904 Segmental and somatic dysfunction of sacral region: Secondary | ICD-10-CM | POA: Diagnosis not present

## 2019-06-28 DIAGNOSIS — M9903 Segmental and somatic dysfunction of lumbar region: Secondary | ICD-10-CM | POA: Diagnosis not present

## 2019-06-28 DIAGNOSIS — M9902 Segmental and somatic dysfunction of thoracic region: Secondary | ICD-10-CM | POA: Diagnosis not present

## 2019-06-28 DIAGNOSIS — M9905 Segmental and somatic dysfunction of pelvic region: Secondary | ICD-10-CM | POA: Diagnosis not present

## 2019-07-18 ENCOUNTER — Encounter: Payer: Self-pay | Admitting: Certified Nurse Midwife

## 2019-08-01 DIAGNOSIS — M9903 Segmental and somatic dysfunction of lumbar region: Secondary | ICD-10-CM | POA: Diagnosis not present

## 2019-08-01 DIAGNOSIS — M9902 Segmental and somatic dysfunction of thoracic region: Secondary | ICD-10-CM | POA: Diagnosis not present

## 2019-08-01 DIAGNOSIS — M9905 Segmental and somatic dysfunction of pelvic region: Secondary | ICD-10-CM | POA: Diagnosis not present

## 2019-08-01 DIAGNOSIS — M9904 Segmental and somatic dysfunction of sacral region: Secondary | ICD-10-CM | POA: Diagnosis not present

## 2019-08-14 DIAGNOSIS — Z23 Encounter for immunization: Secondary | ICD-10-CM | POA: Diagnosis not present

## 2019-08-16 ENCOUNTER — Ambulatory Visit
Admission: RE | Admit: 2019-08-16 | Discharge: 2019-08-16 | Disposition: A | Discharge status: Home / Self Care | Payer: BC Managed Care – PPO | Source: Ambulatory Visit | Attending: Chiropractic Medicine | Admitting: Chiropractic Medicine

## 2019-08-16 ENCOUNTER — Other Ambulatory Visit: Payer: Self-pay | Admitting: Chiropractic Medicine

## 2019-08-16 DIAGNOSIS — M545 Low back pain, unspecified: Secondary | ICD-10-CM

## 2019-08-16 DIAGNOSIS — M9903 Segmental and somatic dysfunction of lumbar region: Secondary | ICD-10-CM | POA: Diagnosis not present

## 2019-08-16 DIAGNOSIS — M9905 Segmental and somatic dysfunction of pelvic region: Secondary | ICD-10-CM | POA: Diagnosis not present

## 2019-08-16 DIAGNOSIS — M9902 Segmental and somatic dysfunction of thoracic region: Secondary | ICD-10-CM | POA: Diagnosis not present

## 2019-08-16 DIAGNOSIS — M9904 Segmental and somatic dysfunction of sacral region: Secondary | ICD-10-CM | POA: Diagnosis not present

## 2019-08-22 DIAGNOSIS — M9902 Segmental and somatic dysfunction of thoracic region: Secondary | ICD-10-CM | POA: Diagnosis not present

## 2019-08-22 DIAGNOSIS — M9903 Segmental and somatic dysfunction of lumbar region: Secondary | ICD-10-CM | POA: Diagnosis not present

## 2019-08-22 DIAGNOSIS — M9904 Segmental and somatic dysfunction of sacral region: Secondary | ICD-10-CM | POA: Diagnosis not present

## 2019-08-22 DIAGNOSIS — M9905 Segmental and somatic dysfunction of pelvic region: Secondary | ICD-10-CM | POA: Diagnosis not present

## 2019-09-13 DIAGNOSIS — M9902 Segmental and somatic dysfunction of thoracic region: Secondary | ICD-10-CM | POA: Diagnosis not present

## 2019-09-13 DIAGNOSIS — Z23 Encounter for immunization: Secondary | ICD-10-CM | POA: Diagnosis not present

## 2019-09-13 DIAGNOSIS — M9905 Segmental and somatic dysfunction of pelvic region: Secondary | ICD-10-CM | POA: Diagnosis not present

## 2019-09-13 DIAGNOSIS — M9903 Segmental and somatic dysfunction of lumbar region: Secondary | ICD-10-CM | POA: Diagnosis not present

## 2019-09-13 DIAGNOSIS — M9904 Segmental and somatic dysfunction of sacral region: Secondary | ICD-10-CM | POA: Diagnosis not present

## 2019-09-15 DIAGNOSIS — Z1211 Encounter for screening for malignant neoplasm of colon: Secondary | ICD-10-CM | POA: Diagnosis not present

## 2019-09-15 DIAGNOSIS — R05 Cough: Secondary | ICD-10-CM | POA: Diagnosis not present

## 2019-09-15 DIAGNOSIS — R1310 Dysphagia, unspecified: Secondary | ICD-10-CM | POA: Diagnosis not present

## 2019-09-18 DIAGNOSIS — J019 Acute sinusitis, unspecified: Secondary | ICD-10-CM | POA: Diagnosis not present

## 2019-09-18 DIAGNOSIS — J40 Bronchitis, not specified as acute or chronic: Secondary | ICD-10-CM | POA: Diagnosis not present

## 2019-10-04 DIAGNOSIS — M9905 Segmental and somatic dysfunction of pelvic region: Secondary | ICD-10-CM | POA: Diagnosis not present

## 2019-10-04 DIAGNOSIS — M9904 Segmental and somatic dysfunction of sacral region: Secondary | ICD-10-CM | POA: Diagnosis not present

## 2019-10-04 DIAGNOSIS — M9903 Segmental and somatic dysfunction of lumbar region: Secondary | ICD-10-CM | POA: Diagnosis not present

## 2019-10-04 DIAGNOSIS — M9902 Segmental and somatic dysfunction of thoracic region: Secondary | ICD-10-CM | POA: Diagnosis not present

## 2019-10-16 DIAGNOSIS — M9903 Segmental and somatic dysfunction of lumbar region: Secondary | ICD-10-CM | POA: Diagnosis not present

## 2019-10-16 DIAGNOSIS — M9904 Segmental and somatic dysfunction of sacral region: Secondary | ICD-10-CM | POA: Diagnosis not present

## 2019-10-16 DIAGNOSIS — M9905 Segmental and somatic dysfunction of pelvic region: Secondary | ICD-10-CM | POA: Diagnosis not present

## 2019-10-16 DIAGNOSIS — M9902 Segmental and somatic dysfunction of thoracic region: Secondary | ICD-10-CM | POA: Diagnosis not present

## 2019-10-23 DIAGNOSIS — L821 Other seborrheic keratosis: Secondary | ICD-10-CM | POA: Diagnosis not present

## 2019-10-23 DIAGNOSIS — X32XXXS Exposure to sunlight, sequela: Secondary | ICD-10-CM | POA: Diagnosis not present

## 2019-10-23 DIAGNOSIS — L814 Other melanin hyperpigmentation: Secondary | ICD-10-CM | POA: Diagnosis not present

## 2019-10-23 DIAGNOSIS — D229 Melanocytic nevi, unspecified: Secondary | ICD-10-CM | POA: Diagnosis not present

## 2019-11-29 DIAGNOSIS — M9902 Segmental and somatic dysfunction of thoracic region: Secondary | ICD-10-CM | POA: Diagnosis not present

## 2019-11-29 DIAGNOSIS — M9905 Segmental and somatic dysfunction of pelvic region: Secondary | ICD-10-CM | POA: Diagnosis not present

## 2019-11-29 DIAGNOSIS — M9904 Segmental and somatic dysfunction of sacral region: Secondary | ICD-10-CM | POA: Diagnosis not present

## 2019-11-29 DIAGNOSIS — M9903 Segmental and somatic dysfunction of lumbar region: Secondary | ICD-10-CM | POA: Diagnosis not present

## 2020-01-12 DIAGNOSIS — Z0000 Encounter for general adult medical examination without abnormal findings: Secondary | ICD-10-CM | POA: Diagnosis not present

## 2020-01-19 DIAGNOSIS — I10 Essential (primary) hypertension: Secondary | ICD-10-CM | POA: Diagnosis not present

## 2020-01-19 DIAGNOSIS — G4733 Obstructive sleep apnea (adult) (pediatric): Secondary | ICD-10-CM | POA: Diagnosis not present

## 2020-01-19 DIAGNOSIS — Z0000 Encounter for general adult medical examination without abnormal findings: Secondary | ICD-10-CM | POA: Diagnosis not present

## 2020-01-19 DIAGNOSIS — J309 Allergic rhinitis, unspecified: Secondary | ICD-10-CM | POA: Diagnosis not present

## 2020-01-19 DIAGNOSIS — E871 Hypo-osmolality and hyponatremia: Secondary | ICD-10-CM | POA: Diagnosis not present

## 2020-02-01 DIAGNOSIS — M9903 Segmental and somatic dysfunction of lumbar region: Secondary | ICD-10-CM | POA: Diagnosis not present

## 2020-02-01 DIAGNOSIS — M9905 Segmental and somatic dysfunction of pelvic region: Secondary | ICD-10-CM | POA: Diagnosis not present

## 2020-02-01 DIAGNOSIS — M9902 Segmental and somatic dysfunction of thoracic region: Secondary | ICD-10-CM | POA: Diagnosis not present

## 2020-02-01 DIAGNOSIS — M9904 Segmental and somatic dysfunction of sacral region: Secondary | ICD-10-CM | POA: Diagnosis not present

## 2020-02-22 DIAGNOSIS — M9902 Segmental and somatic dysfunction of thoracic region: Secondary | ICD-10-CM | POA: Diagnosis not present

## 2020-02-22 DIAGNOSIS — M9904 Segmental and somatic dysfunction of sacral region: Secondary | ICD-10-CM | POA: Diagnosis not present

## 2020-02-22 DIAGNOSIS — M9905 Segmental and somatic dysfunction of pelvic region: Secondary | ICD-10-CM | POA: Diagnosis not present

## 2020-02-22 DIAGNOSIS — M9903 Segmental and somatic dysfunction of lumbar region: Secondary | ICD-10-CM | POA: Diagnosis not present

## 2020-02-23 DIAGNOSIS — Z20822 Contact with and (suspected) exposure to covid-19: Secondary | ICD-10-CM | POA: Diagnosis not present

## 2020-02-23 DIAGNOSIS — I10 Essential (primary) hypertension: Secondary | ICD-10-CM | POA: Diagnosis not present

## 2020-03-27 DIAGNOSIS — Z1231 Encounter for screening mammogram for malignant neoplasm of breast: Secondary | ICD-10-CM | POA: Diagnosis not present

## 2020-04-11 DIAGNOSIS — Z23 Encounter for immunization: Secondary | ICD-10-CM | POA: Diagnosis not present

## 2020-05-03 ENCOUNTER — Ambulatory Visit: Payer: BC Managed Care – PPO | Admitting: Certified Nurse Midwife

## 2020-05-15 DIAGNOSIS — Z20822 Contact with and (suspected) exposure to covid-19: Secondary | ICD-10-CM | POA: Diagnosis not present

## 2020-06-04 DIAGNOSIS — M9904 Segmental and somatic dysfunction of sacral region: Secondary | ICD-10-CM | POA: Diagnosis not present

## 2020-06-04 DIAGNOSIS — M9903 Segmental and somatic dysfunction of lumbar region: Secondary | ICD-10-CM | POA: Diagnosis not present

## 2020-06-04 DIAGNOSIS — M9902 Segmental and somatic dysfunction of thoracic region: Secondary | ICD-10-CM | POA: Diagnosis not present

## 2020-06-04 DIAGNOSIS — M9905 Segmental and somatic dysfunction of pelvic region: Secondary | ICD-10-CM | POA: Diagnosis not present

## 2020-06-05 DIAGNOSIS — L531 Erythema annulare centrifugum: Secondary | ICD-10-CM | POA: Diagnosis not present

## 2020-07-01 DIAGNOSIS — M9902 Segmental and somatic dysfunction of thoracic region: Secondary | ICD-10-CM | POA: Diagnosis not present

## 2020-07-01 DIAGNOSIS — M9904 Segmental and somatic dysfunction of sacral region: Secondary | ICD-10-CM | POA: Diagnosis not present

## 2020-07-01 DIAGNOSIS — M9905 Segmental and somatic dysfunction of pelvic region: Secondary | ICD-10-CM | POA: Diagnosis not present

## 2020-07-01 DIAGNOSIS — M9903 Segmental and somatic dysfunction of lumbar region: Secondary | ICD-10-CM | POA: Diagnosis not present

## 2020-07-16 DIAGNOSIS — H43393 Other vitreous opacities, bilateral: Secondary | ICD-10-CM | POA: Diagnosis not present

## 2020-07-16 DIAGNOSIS — H04123 Dry eye syndrome of bilateral lacrimal glands: Secondary | ICD-10-CM | POA: Diagnosis not present

## 2020-07-16 DIAGNOSIS — H2513 Age-related nuclear cataract, bilateral: Secondary | ICD-10-CM | POA: Diagnosis not present

## 2020-07-16 DIAGNOSIS — H5203 Hypermetropia, bilateral: Secondary | ICD-10-CM | POA: Diagnosis not present

## 2020-07-29 DIAGNOSIS — E785 Hyperlipidemia, unspecified: Secondary | ICD-10-CM | POA: Diagnosis not present

## 2020-09-03 DIAGNOSIS — M79675 Pain in left toe(s): Secondary | ICD-10-CM | POA: Diagnosis not present

## 2020-09-03 DIAGNOSIS — M778 Other enthesopathies, not elsewhere classified: Secondary | ICD-10-CM | POA: Diagnosis not present

## 2020-09-24 DIAGNOSIS — L531 Erythema annulare centrifugum: Secondary | ICD-10-CM | POA: Diagnosis not present

## 2020-09-25 DIAGNOSIS — M9905 Segmental and somatic dysfunction of pelvic region: Secondary | ICD-10-CM | POA: Diagnosis not present

## 2020-09-25 DIAGNOSIS — M9904 Segmental and somatic dysfunction of sacral region: Secondary | ICD-10-CM | POA: Diagnosis not present

## 2020-09-25 DIAGNOSIS — M9902 Segmental and somatic dysfunction of thoracic region: Secondary | ICD-10-CM | POA: Diagnosis not present

## 2020-09-25 DIAGNOSIS — M9903 Segmental and somatic dysfunction of lumbar region: Secondary | ICD-10-CM | POA: Diagnosis not present

## 2020-10-15 DIAGNOSIS — M9902 Segmental and somatic dysfunction of thoracic region: Secondary | ICD-10-CM | POA: Diagnosis not present

## 2020-10-15 DIAGNOSIS — M9905 Segmental and somatic dysfunction of pelvic region: Secondary | ICD-10-CM | POA: Diagnosis not present

## 2020-10-15 DIAGNOSIS — M9903 Segmental and somatic dysfunction of lumbar region: Secondary | ICD-10-CM | POA: Diagnosis not present

## 2020-10-15 DIAGNOSIS — M9904 Segmental and somatic dysfunction of sacral region: Secondary | ICD-10-CM | POA: Diagnosis not present

## 2020-10-29 DIAGNOSIS — M778 Other enthesopathies, not elsewhere classified: Secondary | ICD-10-CM | POA: Diagnosis not present

## 2020-11-22 DIAGNOSIS — M9903 Segmental and somatic dysfunction of lumbar region: Secondary | ICD-10-CM | POA: Diagnosis not present

## 2020-11-22 DIAGNOSIS — M9902 Segmental and somatic dysfunction of thoracic region: Secondary | ICD-10-CM | POA: Diagnosis not present

## 2020-11-22 DIAGNOSIS — M9904 Segmental and somatic dysfunction of sacral region: Secondary | ICD-10-CM | POA: Diagnosis not present

## 2020-11-22 DIAGNOSIS — M9905 Segmental and somatic dysfunction of pelvic region: Secondary | ICD-10-CM | POA: Diagnosis not present

## 2020-12-19 DIAGNOSIS — E669 Obesity, unspecified: Secondary | ICD-10-CM | POA: Diagnosis not present

## 2020-12-19 DIAGNOSIS — G4733 Obstructive sleep apnea (adult) (pediatric): Secondary | ICD-10-CM | POA: Diagnosis not present

## 2021-01-03 DIAGNOSIS — M9904 Segmental and somatic dysfunction of sacral region: Secondary | ICD-10-CM | POA: Diagnosis not present

## 2021-01-03 DIAGNOSIS — M9903 Segmental and somatic dysfunction of lumbar region: Secondary | ICD-10-CM | POA: Diagnosis not present

## 2021-01-03 DIAGNOSIS — M9902 Segmental and somatic dysfunction of thoracic region: Secondary | ICD-10-CM | POA: Diagnosis not present

## 2021-01-03 DIAGNOSIS — M9905 Segmental and somatic dysfunction of pelvic region: Secondary | ICD-10-CM | POA: Diagnosis not present

## 2021-02-14 DIAGNOSIS — E782 Mixed hyperlipidemia: Secondary | ICD-10-CM | POA: Diagnosis not present

## 2021-02-14 DIAGNOSIS — Z0000 Encounter for general adult medical examination without abnormal findings: Secondary | ICD-10-CM | POA: Diagnosis not present

## 2021-02-14 DIAGNOSIS — G4733 Obstructive sleep apnea (adult) (pediatric): Secondary | ICD-10-CM | POA: Diagnosis not present

## 2021-02-14 DIAGNOSIS — I10 Essential (primary) hypertension: Secondary | ICD-10-CM | POA: Diagnosis not present

## 2021-02-14 DIAGNOSIS — Z79899 Other long term (current) drug therapy: Secondary | ICD-10-CM | POA: Diagnosis not present

## 2021-02-14 DIAGNOSIS — Z1329 Encounter for screening for other suspected endocrine disorder: Secondary | ICD-10-CM | POA: Diagnosis not present

## 2021-02-14 DIAGNOSIS — M791 Myalgia, unspecified site: Secondary | ICD-10-CM | POA: Diagnosis not present

## 2021-02-18 DIAGNOSIS — Z0000 Encounter for general adult medical examination without abnormal findings: Secondary | ICD-10-CM | POA: Diagnosis not present

## 2021-02-18 DIAGNOSIS — E782 Mixed hyperlipidemia: Secondary | ICD-10-CM | POA: Diagnosis not present

## 2021-02-18 DIAGNOSIS — G4733 Obstructive sleep apnea (adult) (pediatric): Secondary | ICD-10-CM | POA: Diagnosis not present

## 2021-02-18 DIAGNOSIS — I10 Essential (primary) hypertension: Secondary | ICD-10-CM | POA: Diagnosis not present

## 2021-02-18 DIAGNOSIS — Z9989 Dependence on other enabling machines and devices: Secondary | ICD-10-CM | POA: Diagnosis not present

## 2021-02-25 IMAGING — CR DG LUMBAR SPINE COMPLETE 4+V
5 series · 5 of 5 positions shown · non-contrast
Comparison: None.

CLINICAL DATA: Lower back pain for 5 days after gardening.

EXAM:
LUMBAR SPINE - COMPLETE 4+ VIEW

[t lumbar spine ap]
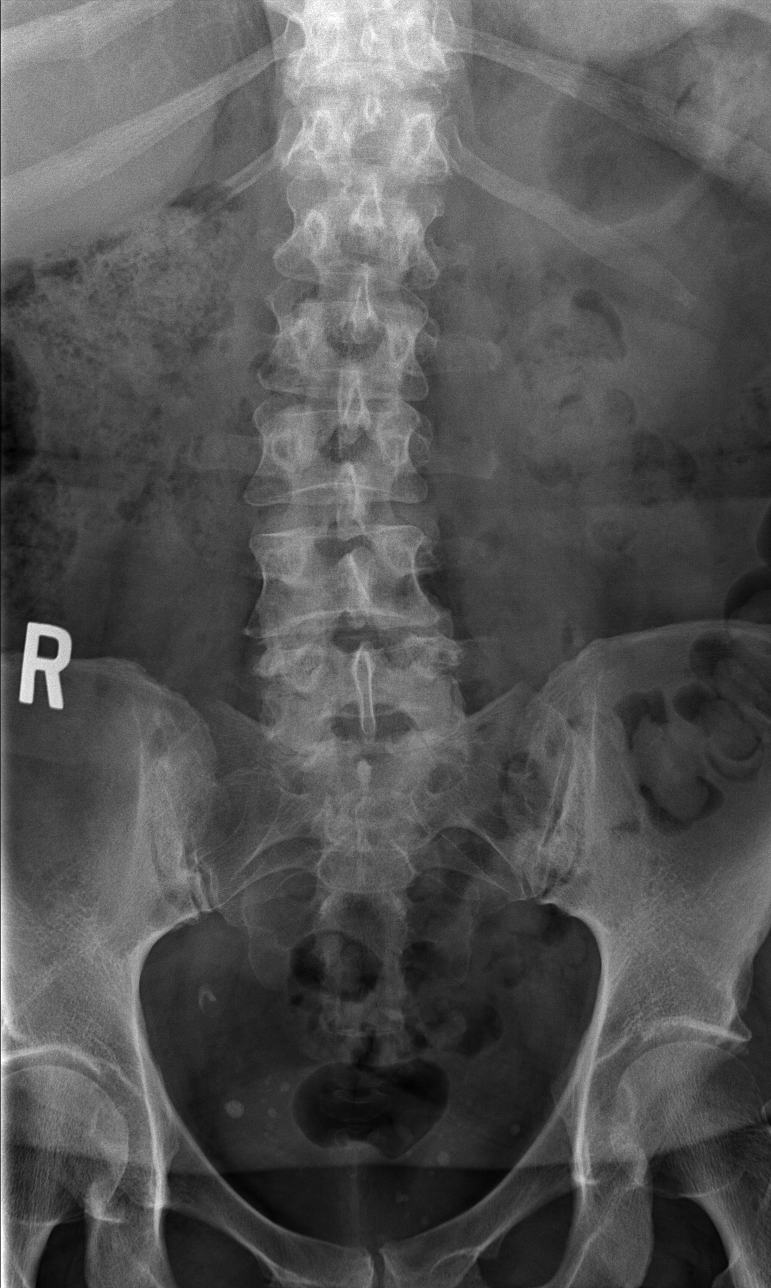

[t lumbar spine obl (1 of 2)]
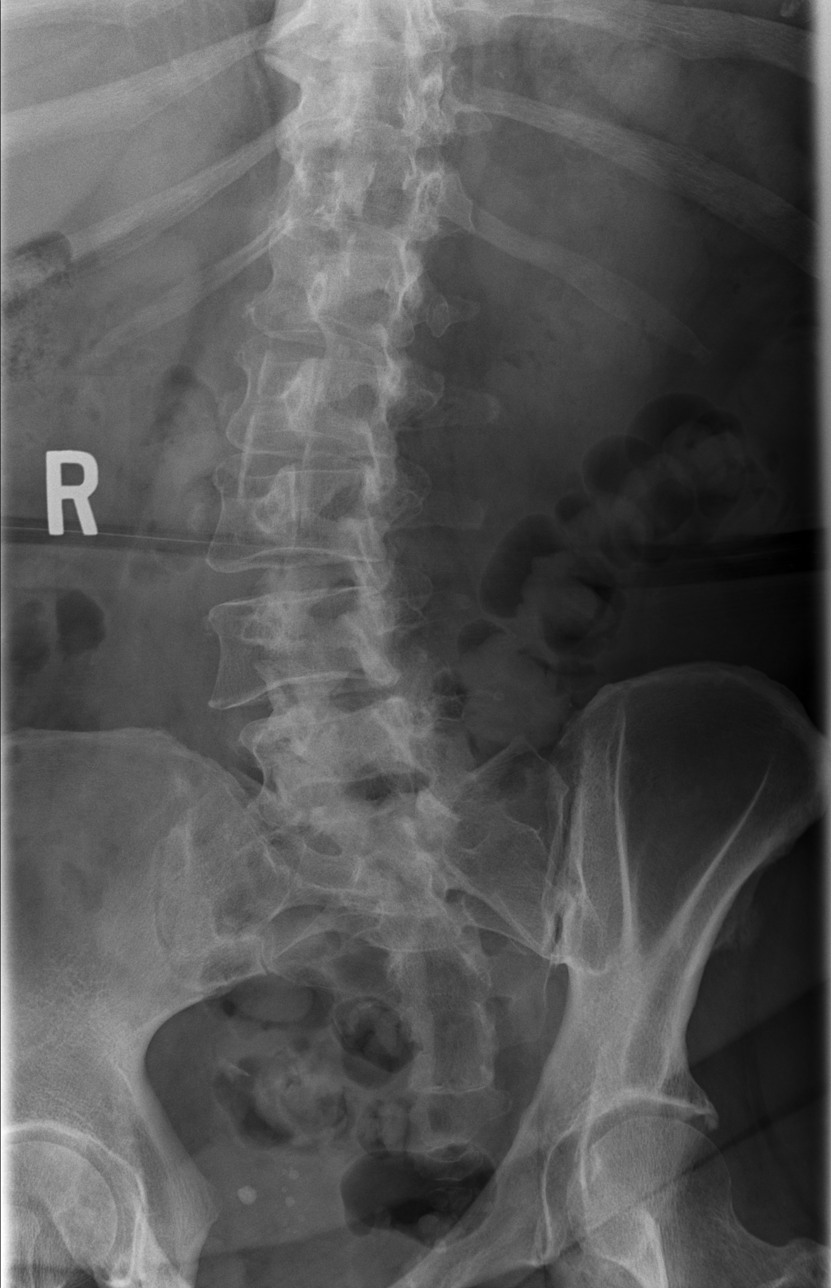

[t lumbar spine obl (2 of 2)]
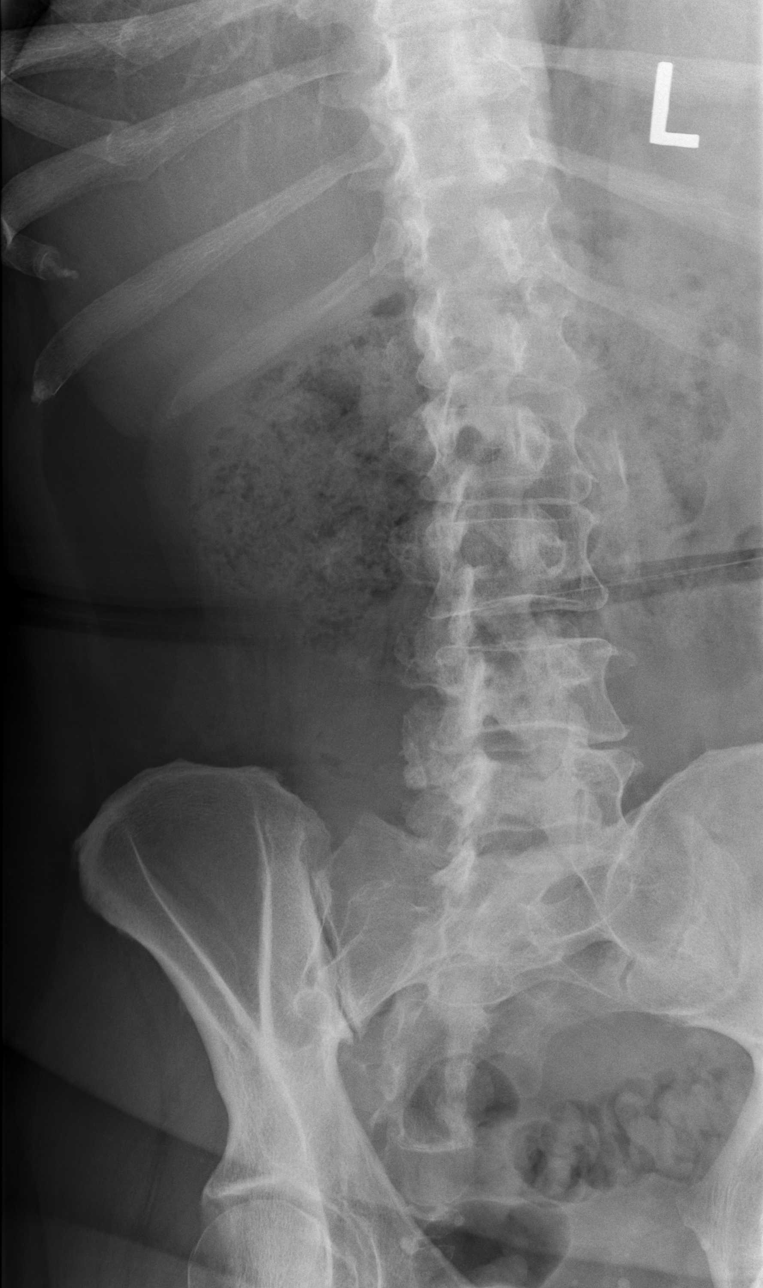

[t lumbar spine lat]
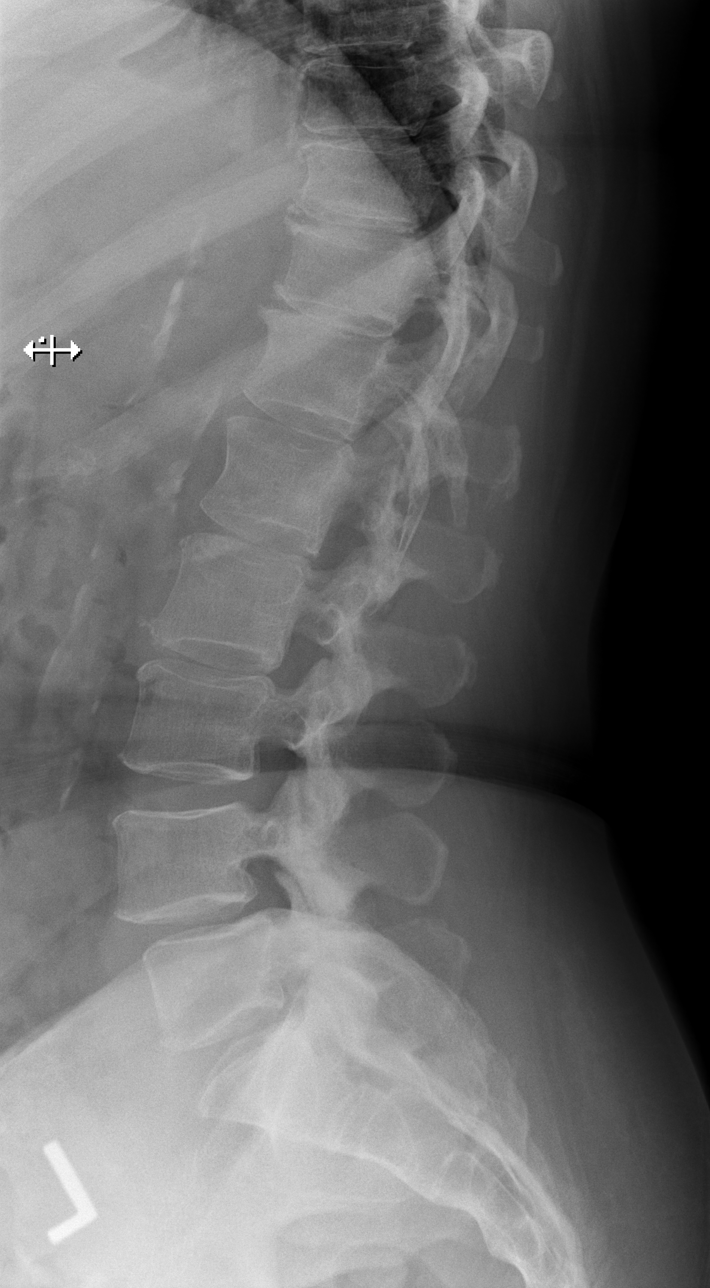

[t lumbar l-5 s-1 spot]
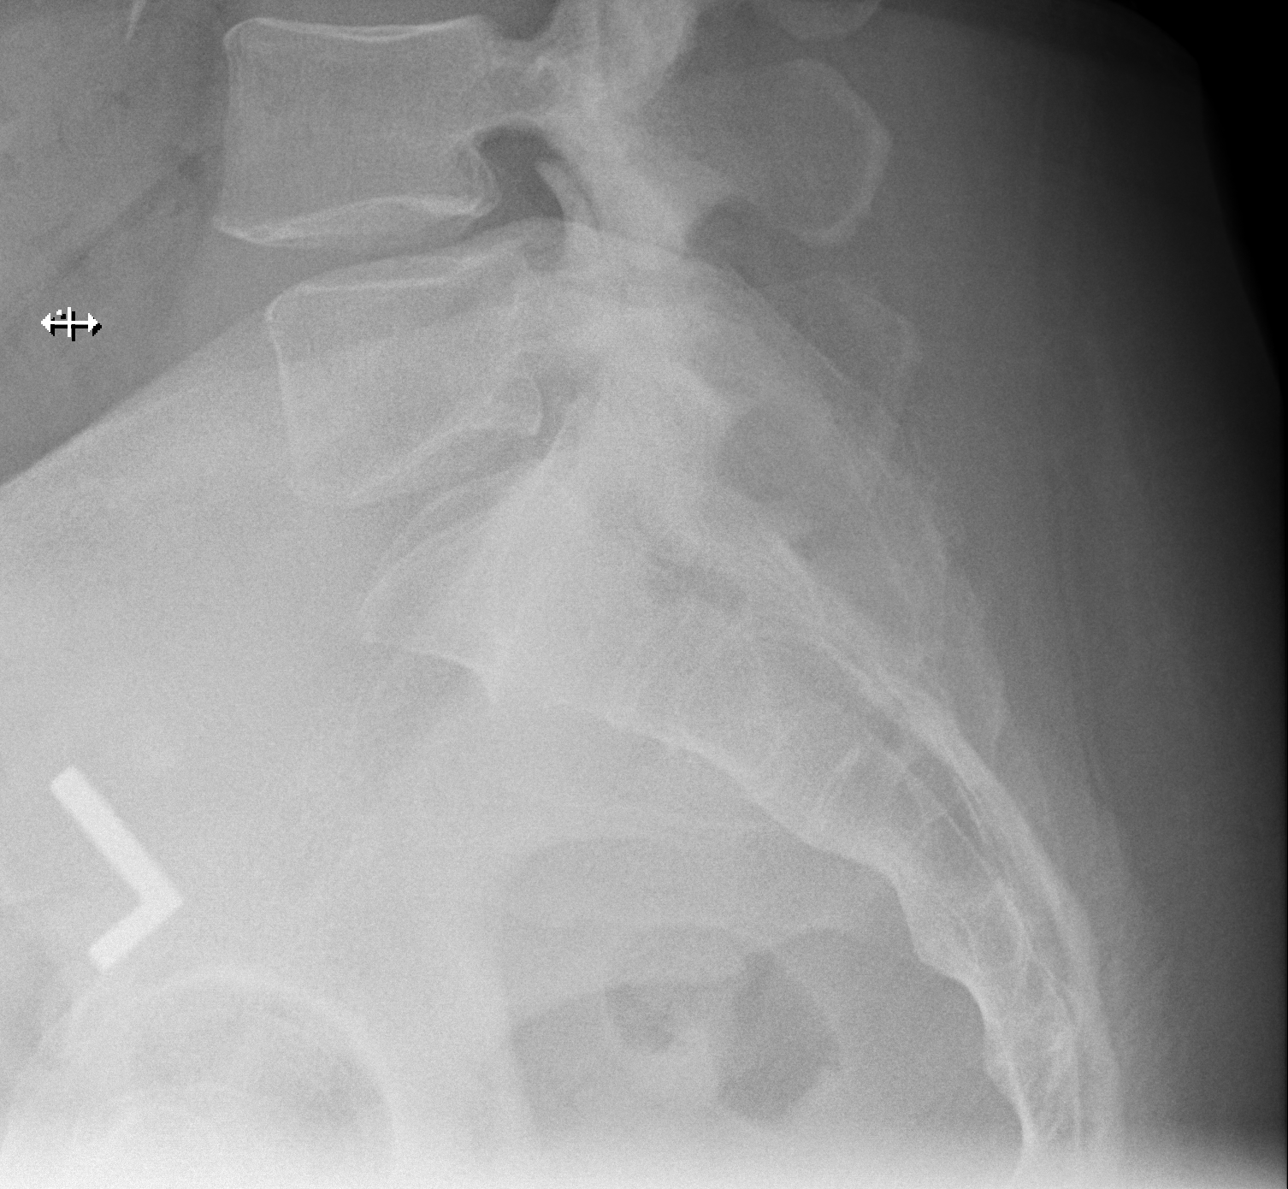

[5 of 5 positions shown; findings below may reference images not displayed]

FINDINGS: 02/05/2011 and T12-L1 degenerative disc disease. Grade 1
anterolisthesis L4-5. Preservation of the vertebral body heights.
Lower lumbar spine facet degenerative changes. SI joints are
unremarkable. Pelvic phleboliths. Stool throughout the colon.
IMPRESSION: 1. No acute osseous abnormality.
2. Degenerative disc disease.

## 2021-03-18 DIAGNOSIS — M9902 Segmental and somatic dysfunction of thoracic region: Secondary | ICD-10-CM | POA: Diagnosis not present

## 2021-03-18 DIAGNOSIS — M9905 Segmental and somatic dysfunction of pelvic region: Secondary | ICD-10-CM | POA: Diagnosis not present

## 2021-03-18 DIAGNOSIS — M9903 Segmental and somatic dysfunction of lumbar region: Secondary | ICD-10-CM | POA: Diagnosis not present

## 2021-03-18 DIAGNOSIS — M9904 Segmental and somatic dysfunction of sacral region: Secondary | ICD-10-CM | POA: Diagnosis not present

## 2021-04-17 DIAGNOSIS — M9903 Segmental and somatic dysfunction of lumbar region: Secondary | ICD-10-CM | POA: Diagnosis not present

## 2021-04-17 DIAGNOSIS — M9902 Segmental and somatic dysfunction of thoracic region: Secondary | ICD-10-CM | POA: Diagnosis not present

## 2021-04-17 DIAGNOSIS — M9905 Segmental and somatic dysfunction of pelvic region: Secondary | ICD-10-CM | POA: Diagnosis not present

## 2021-04-17 DIAGNOSIS — M9904 Segmental and somatic dysfunction of sacral region: Secondary | ICD-10-CM | POA: Diagnosis not present

## 2021-04-29 DIAGNOSIS — M9903 Segmental and somatic dysfunction of lumbar region: Secondary | ICD-10-CM | POA: Diagnosis not present

## 2021-04-29 DIAGNOSIS — M9905 Segmental and somatic dysfunction of pelvic region: Secondary | ICD-10-CM | POA: Diagnosis not present

## 2021-04-29 DIAGNOSIS — M9902 Segmental and somatic dysfunction of thoracic region: Secondary | ICD-10-CM | POA: Diagnosis not present

## 2021-04-29 DIAGNOSIS — M9904 Segmental and somatic dysfunction of sacral region: Secondary | ICD-10-CM | POA: Diagnosis not present

## 2021-05-05 DIAGNOSIS — Z1231 Encounter for screening mammogram for malignant neoplasm of breast: Secondary | ICD-10-CM | POA: Diagnosis not present

## 2021-05-07 ENCOUNTER — Other Ambulatory Visit (HOSPITAL_COMMUNITY)
Admission: RE | Admit: 2021-05-07 | Discharge: 2021-05-07 | Disposition: A | Discharge status: Home / Self Care | Payer: BC Managed Care – PPO | Source: Ambulatory Visit | Attending: Nurse Practitioner | Admitting: Nurse Practitioner

## 2021-05-07 ENCOUNTER — Other Ambulatory Visit: Payer: Self-pay

## 2021-05-07 ENCOUNTER — Encounter: Payer: Self-pay | Admitting: Nurse Practitioner

## 2021-05-07 ENCOUNTER — Ambulatory Visit (INDEPENDENT_AMBULATORY_CARE_PROVIDER_SITE_OTHER): Payer: BC Managed Care – PPO | Admitting: Nurse Practitioner

## 2021-05-07 VITALS — BP 154/80 | Ht 63.0 in | Wt 217.0 lb

## 2021-05-07 DIAGNOSIS — Z780 Asymptomatic menopausal state: Secondary | ICD-10-CM | POA: Diagnosis not present

## 2021-05-07 DIAGNOSIS — Z01419 Encounter for gynecological examination (general) (routine) without abnormal findings: Secondary | ICD-10-CM

## 2021-05-07 DIAGNOSIS — Z1211 Encounter for screening for malignant neoplasm of colon: Secondary | ICD-10-CM

## 2021-05-07 NOTE — Progress Notes (Signed)
° °  Meredith Gilbert 06-14-1955 [id number]   History:  66 y.o. [id number] presents for annual exam without GYN complaints. Postmenopausal - no HRT. S/P 2016 LAVH BSO for AUB/complex endometrial hyperplasia with benign pathology. Normal pap history. HTN managed by PCP.   Gynecologic History Patient's last menstrual period was 02/25/2014 (approximate).   Contraception/Family planning: status post hysterectomy Sexually active: Yes  Health Maintenance Last Pap: 05/25/2015. Results were: Normal Last mammogram: 05/05/2020 per patient. Results were: No report yet Last colonoscopy: 2012. Results were: Normal Last Dexa: 2018. Results were: Normal  Past medical history, past surgical history, family history and social history were all reviewed and documented in the EPIC chart. Married. 2 daughters in [place], son in [place]. 3 grandchildren ages 62-26. Retired.  ROS:  A ROS was performed and pertinent positives and negatives are included.  Exam:  Vitals:   05/07/21 1456  BP: (!) 154/80  Weight: 217 lb (98.4 kg)  Height: 5\' 3"  (1.6 m)   Body mass index is 38.44 kg/m.  General appearance:  Normal Thyroid:  Symmetrical, normal in size, without palpable masses or nodularity. Respiratory  Auscultation:  Clear without wheezing or rhonchi Cardiovascular  Auscultation:  Regular rate, without rubs, murmurs or gallops  Edema/varicosities:  Not grossly evident Abdominal  Soft,nontender, without masses, guarding or rebound.  Liver/spleen:  No organomegaly noted  Hernia:  None appreciated  Skin  Inspection:  Grossly normal Breasts: Examined lying and sitting.   Right: Without masses, retractions, nipple discharge or axillary adenopathy.   Left: Without masses, retractions, nipple discharge or axillary adenopathy. Genitourinary   Inguinal/mons:  Normal without inguinal adenopathy  External genitalia:  Normal appearing vulva with no masses, tenderness, or lesions  BUS/Urethra/Skene's glands:   Normal  Vagina:  Normal appearing with normal color and discharge, no lesions  Cervix:  Absent  Uterus:  Absent  Adnexa/parametria:     Rt: Normal in size, without masses or tenderness.   Lt: Normal in size, without masses or tenderness.  Anus and perineum: Normal  Digital rectal exam: Normal sphincter tone without palpated masses or tenderness  Patient informed chaperone available to be present for breast and pelvic exam. Patient has requested no chaperone to be present. Patient has been advised what will be completed during breast and pelvic exam.   Assessment/Plan:  66 y.o. 66 for annual exam.   Well female exam with routine gynecological exam - Plan: Cytology - PAP( [employer]). Education provided on SBEs, importance of preventative screenings, current guidelines, high calcium diet, regular exercise, and multivitamin daily.  Labs with PCP.  Postmenopausal - no HRT. S/P LAVH  BSO for AUB/complex endometrial hyperplasia, benign pathology.  Screening for colon cancer - Plan: Cologuard. Normal colonoscopy in 2012. Requesting Cologuard.   Screening for cervical cancer - Normal Pap history. Discussed current guidelines and option to stop screenings. She would like to continue. Vaginal pap with reflex collected today.   Screening for breast cancer - Normal mammogram history.  Continue annual screenings.  Normal breast exam today.  Screening for osteoporosis - Normal bone density 03/2017. Will plan to repeat next year at 5-year interval.   Return in 1 year for annual.     04/2017 DNP, 3:33 PM 05/07/2021

## 2021-05-09 LAB — CYTOLOGY - PAP: Diagnosis: NEGATIVE

## 2021-05-16 DIAGNOSIS — Z1211 Encounter for screening for malignant neoplasm of colon: Secondary | ICD-10-CM | POA: Diagnosis not present

## 2021-05-23 LAB — COLOGUARD: COLOGUARD: NEGATIVE

## 2021-05-26 DIAGNOSIS — M9904 Segmental and somatic dysfunction of sacral region: Secondary | ICD-10-CM | POA: Diagnosis not present

## 2021-05-26 DIAGNOSIS — M9903 Segmental and somatic dysfunction of lumbar region: Secondary | ICD-10-CM | POA: Diagnosis not present

## 2021-05-26 DIAGNOSIS — M9905 Segmental and somatic dysfunction of pelvic region: Secondary | ICD-10-CM | POA: Diagnosis not present

## 2021-05-26 DIAGNOSIS — M9902 Segmental and somatic dysfunction of thoracic region: Secondary | ICD-10-CM | POA: Diagnosis not present

## 2021-06-03 DIAGNOSIS — I10 Essential (primary) hypertension: Secondary | ICD-10-CM | POA: Diagnosis not present

## 2021-06-03 DIAGNOSIS — G4733 Obstructive sleep apnea (adult) (pediatric): Secondary | ICD-10-CM | POA: Diagnosis not present

## 2021-06-19 DIAGNOSIS — M9902 Segmental and somatic dysfunction of thoracic region: Secondary | ICD-10-CM | POA: Diagnosis not present

## 2021-06-19 DIAGNOSIS — M9905 Segmental and somatic dysfunction of pelvic region: Secondary | ICD-10-CM | POA: Diagnosis not present

## 2021-06-19 DIAGNOSIS — M9903 Segmental and somatic dysfunction of lumbar region: Secondary | ICD-10-CM | POA: Diagnosis not present

## 2021-06-19 DIAGNOSIS — M9904 Segmental and somatic dysfunction of sacral region: Secondary | ICD-10-CM | POA: Diagnosis not present

## 2021-07-01 DIAGNOSIS — G4733 Obstructive sleep apnea (adult) (pediatric): Secondary | ICD-10-CM | POA: Diagnosis not present

## 2021-07-01 DIAGNOSIS — I10 Essential (primary) hypertension: Secondary | ICD-10-CM | POA: Diagnosis not present

## 2021-07-29 DIAGNOSIS — H04123 Dry eye syndrome of bilateral lacrimal glands: Secondary | ICD-10-CM | POA: Diagnosis not present

## 2021-07-29 DIAGNOSIS — H43393 Other vitreous opacities, bilateral: Secondary | ICD-10-CM | POA: Diagnosis not present

## 2021-07-29 DIAGNOSIS — H5203 Hypermetropia, bilateral: Secondary | ICD-10-CM | POA: Diagnosis not present

## 2021-07-29 DIAGNOSIS — H2513 Age-related nuclear cataract, bilateral: Secondary | ICD-10-CM | POA: Diagnosis not present

## 2021-08-01 DIAGNOSIS — G4733 Obstructive sleep apnea (adult) (pediatric): Secondary | ICD-10-CM | POA: Diagnosis not present

## 2021-08-01 DIAGNOSIS — I10 Essential (primary) hypertension: Secondary | ICD-10-CM | POA: Diagnosis not present

## 2021-08-31 DIAGNOSIS — I10 Essential (primary) hypertension: Secondary | ICD-10-CM | POA: Diagnosis not present

## 2021-08-31 DIAGNOSIS — G4733 Obstructive sleep apnea (adult) (pediatric): Secondary | ICD-10-CM | POA: Diagnosis not present

## 2021-09-03 DIAGNOSIS — M9903 Segmental and somatic dysfunction of lumbar region: Secondary | ICD-10-CM | POA: Diagnosis not present

## 2021-09-03 DIAGNOSIS — M9905 Segmental and somatic dysfunction of pelvic region: Secondary | ICD-10-CM | POA: Diagnosis not present

## 2021-09-03 DIAGNOSIS — M9902 Segmental and somatic dysfunction of thoracic region: Secondary | ICD-10-CM | POA: Diagnosis not present

## 2021-09-03 DIAGNOSIS — M9904 Segmental and somatic dysfunction of sacral region: Secondary | ICD-10-CM | POA: Diagnosis not present

## 2021-10-02 DIAGNOSIS — M9904 Segmental and somatic dysfunction of sacral region: Secondary | ICD-10-CM | POA: Diagnosis not present

## 2021-10-02 DIAGNOSIS — M9902 Segmental and somatic dysfunction of thoracic region: Secondary | ICD-10-CM | POA: Diagnosis not present

## 2021-10-02 DIAGNOSIS — M9903 Segmental and somatic dysfunction of lumbar region: Secondary | ICD-10-CM | POA: Diagnosis not present

## 2021-10-02 DIAGNOSIS — M9905 Segmental and somatic dysfunction of pelvic region: Secondary | ICD-10-CM | POA: Diagnosis not present

## 2022-02-18 DIAGNOSIS — Z0000 Encounter for general adult medical examination without abnormal findings: Secondary | ICD-10-CM | POA: Diagnosis not present

## 2022-02-20 DIAGNOSIS — M25512 Pain in left shoulder: Secondary | ICD-10-CM | POA: Diagnosis not present

## 2022-02-20 DIAGNOSIS — M19042 Primary osteoarthritis, left hand: Secondary | ICD-10-CM | POA: Diagnosis not present

## 2022-02-20 DIAGNOSIS — M79642 Pain in left hand: Secondary | ICD-10-CM | POA: Diagnosis not present

## 2022-02-20 DIAGNOSIS — R936 Abnormal findings on diagnostic imaging of limbs: Secondary | ICD-10-CM | POA: Diagnosis not present

## 2022-02-20 DIAGNOSIS — Z0000 Encounter for general adult medical examination without abnormal findings: Secondary | ICD-10-CM | POA: Diagnosis not present

## 2022-02-20 DIAGNOSIS — M19212 Secondary osteoarthritis, left shoulder: Secondary | ICD-10-CM | POA: Diagnosis not present

## 2022-02-20 DIAGNOSIS — I10 Essential (primary) hypertension: Secondary | ICD-10-CM | POA: Diagnosis not present

## 2022-02-20 DIAGNOSIS — G4733 Obstructive sleep apnea (adult) (pediatric): Secondary | ICD-10-CM | POA: Diagnosis not present

## 2022-02-20 DIAGNOSIS — E782 Mixed hyperlipidemia: Secondary | ICD-10-CM | POA: Diagnosis not present

## 2022-02-20 DIAGNOSIS — E559 Vitamin D deficiency, unspecified: Secondary | ICD-10-CM | POA: Diagnosis not present

## 2022-02-27 DIAGNOSIS — M19032 Primary osteoarthritis, left wrist: Secondary | ICD-10-CM | POA: Diagnosis not present

## 2022-02-27 DIAGNOSIS — M25562 Pain in left knee: Secondary | ICD-10-CM | POA: Diagnosis not present

## 2022-02-27 DIAGNOSIS — S62002A Unspecified fracture of navicular [scaphoid] bone of left wrist, initial encounter for closed fracture: Secondary | ICD-10-CM | POA: Diagnosis not present

## 2022-02-27 DIAGNOSIS — M25532 Pain in left wrist: Secondary | ICD-10-CM | POA: Diagnosis not present

## 2022-02-27 DIAGNOSIS — R936 Abnormal findings on diagnostic imaging of limbs: Secondary | ICD-10-CM | POA: Diagnosis not present

## 2022-04-17 DIAGNOSIS — M79645 Pain in left finger(s): Secondary | ICD-10-CM | POA: Diagnosis not present

## 2022-04-17 DIAGNOSIS — M1812 Unilateral primary osteoarthritis of first carpometacarpal joint, left hand: Secondary | ICD-10-CM | POA: Diagnosis not present

## 2022-04-17 DIAGNOSIS — R936 Abnormal findings on diagnostic imaging of limbs: Secondary | ICD-10-CM | POA: Diagnosis not present

## 2022-05-06 DIAGNOSIS — M9903 Segmental and somatic dysfunction of lumbar region: Secondary | ICD-10-CM | POA: Diagnosis not present

## 2022-05-06 DIAGNOSIS — M9904 Segmental and somatic dysfunction of sacral region: Secondary | ICD-10-CM | POA: Diagnosis not present

## 2022-05-06 DIAGNOSIS — M9905 Segmental and somatic dysfunction of pelvic region: Secondary | ICD-10-CM | POA: Diagnosis not present

## 2022-05-06 DIAGNOSIS — M9902 Segmental and somatic dysfunction of thoracic region: Secondary | ICD-10-CM | POA: Diagnosis not present

## 2022-05-11 ENCOUNTER — Ambulatory Visit (INDEPENDENT_AMBULATORY_CARE_PROVIDER_SITE_OTHER): Payer: BC Managed Care – PPO | Admitting: Nurse Practitioner

## 2022-05-11 ENCOUNTER — Encounter: Payer: Self-pay | Admitting: Nurse Practitioner

## 2022-05-11 VITALS — BP 124/62 | HR 72 | Ht 63.0 in | Wt 215.0 lb

## 2022-05-11 DIAGNOSIS — Z01419 Encounter for gynecological examination (general) (routine) without abnormal findings: Secondary | ICD-10-CM | POA: Diagnosis not present

## 2022-05-11 DIAGNOSIS — Z780 Asymptomatic menopausal state: Secondary | ICD-10-CM

## 2022-05-11 NOTE — Progress Notes (Signed)
   Meredith Gilbert [birth year] [id number]   History:  67 y.o. [id number] presents for annual exam without GYN complaints. Postmenopausal - no HRT. S/P 2016 LAVH BSO for AUB/complex endometrial hyperplasia with benign pathology. Normal pap history. HTN managed by PCP. Fractured left wrist 03/2021 with fall but did not know until recently when she began having pain, OA being managed by sports medicine.   Gynecologic History Patient's last menstrual period was 02/25/2014 (approximate).   Contraception/Family planning: status post hysterectomy Sexually active: Yes  Health Maintenance Last Pap: 05/07/2021. Results were: Normal, 3-year repeat Last mammogram: 04/2021 per patient. Results were: Normal Last colonoscopy: 10/26/2010. Results were: Normal. Negative Cologuard 04/2021 Last Dexa: 04/09/2017. Results were: Normal.   Past medical history, past surgical history, family history and social history were all reviewed and documented in the EPIC chart. Married. 2 daughters in [place], son in [place]. 3 grandchildren ages 29-27. Retired.  ROS:  A ROS was performed and pertinent positives and negatives are included.  Exam:  Vitals:   05/11/22 0816  BP: 124/62  Pulse: 72  SpO2: 100%  Weight: 215 lb (97.5 kg)  Height: 5\' 3"  (1.6 m)    Body mass index is 38.09 kg/m.  General appearance:  Normal Thyroid:  Symmetrical, normal in size, without palpable masses or nodularity. Respiratory  Auscultation:  Clear without wheezing or rhonchi Cardiovascular  Auscultation:  Regular rate, without rubs, murmurs or gallops  Edema/varicosities:  Not grossly evident Abdominal  Soft,nontender, without masses, guarding or rebound.  Liver/spleen:  No organomegaly noted  Hernia:  None appreciated  Skin  Inspection:  Grossly normal Breasts: Examined lying and sitting.   Right: Without masses, retractions, nipple discharge or axillary adenopathy.   Left: Without masses, retractions, nipple discharge or  axillary adenopathy. Genitourinary   Inguinal/mons:  Normal without inguinal adenopathy  External genitalia:  Normal appearing vulva with no masses, tenderness, or lesions  BUS/Urethra/Skene's glands:  Normal  Vagina:  Normal appearing with normal color and discharge, no lesions  Cervix:  Absent  Uterus:  Absent  Adnexa/parametria:     Rt: Normal in size, without masses or tenderness.   Lt: Normal in size, without masses or tenderness.  Anus and perineum: Normal  Digital rectal exam: Normal sphincter tone without palpated masses or tenderness  Patient informed chaperone available to be present for breast and pelvic exam. Patient has requested no chaperone to be present. Patient has been advised what will be completed during breast and pelvic exam.   Assessment/Plan:  67 y.o. Meredith Gilbert for annual exam.   Well female exam with routine gynecological exam - Education provided on SBEs, importance of preventative screenings, current guidelines, high calcium diet, regular exercise, and multivitamin daily.  Labs with PCP.  Postmenopausal - Plan: DG Bone Density. No HRT. S/P LAVH  BSO for AUB/complex endometrial hyperplasia, benign pathology.  Screening for colon cancer - Negative Cologuard 04/2021. Will repeat at 3-year interval per recommendation.   Screening for cervical cancer - Normal Pap history. Discussed current guidelines and option to stop screenings. She would like to continue. Will repeat at 3-year interval per guidelines.   Screening for breast cancer - Normal mammogram history.  Continue annual screenings.  Normal breast exam today.  Screening for osteoporosis - Normal bone density 03/2017. Recommend repeating now due to wrist fracture, OA.   Return in 1 year for annual.     Meredith Gammon DNP, 8:35 AM 05/11/2022

## 2022-06-16 ENCOUNTER — Other Ambulatory Visit: Payer: Self-pay | Admitting: Nurse Practitioner

## 2022-06-16 ENCOUNTER — Ambulatory Visit (INDEPENDENT_AMBULATORY_CARE_PROVIDER_SITE_OTHER): Payer: BC Managed Care – PPO

## 2022-06-16 DIAGNOSIS — Z13820 Encounter for screening for osteoporosis: Secondary | ICD-10-CM

## 2022-06-16 DIAGNOSIS — Z780 Asymptomatic menopausal state: Secondary | ICD-10-CM | POA: Diagnosis not present

## 2022-06-16 DIAGNOSIS — Z01419 Encounter for gynecological examination (general) (routine) without abnormal findings: Secondary | ICD-10-CM

## 2023-06-16 ENCOUNTER — Encounter: Payer: Medicare HMO | Admitting: Nurse Practitioner

## 2023-07-06 ENCOUNTER — Ambulatory Visit (INDEPENDENT_AMBULATORY_CARE_PROVIDER_SITE_OTHER): Payer: Medicare HMO | Admitting: Nurse Practitioner

## 2023-07-06 ENCOUNTER — Encounter: Payer: Self-pay | Admitting: Nurse Practitioner

## 2023-07-06 VITALS — BP 118/64 | HR 66 | Ht 63.0 in | Wt 210.0 lb

## 2023-07-06 DIAGNOSIS — Z780 Asymptomatic menopausal state: Secondary | ICD-10-CM | POA: Diagnosis not present

## 2023-07-06 DIAGNOSIS — N760 Acute vaginitis: Secondary | ICD-10-CM

## 2023-07-06 DIAGNOSIS — N898 Other specified noninflammatory disorders of vagina: Secondary | ICD-10-CM

## 2023-07-06 DIAGNOSIS — B372 Candidiasis of skin and nail: Secondary | ICD-10-CM | POA: Diagnosis not present

## 2023-07-06 DIAGNOSIS — Z01419 Encounter for gynecological examination (general) (routine) without abnormal findings: Secondary | ICD-10-CM

## 2023-07-06 LAB — WET PREP FOR TRICH, YEAST, CLUE

## 2023-07-06 MED ORDER — METRONIDAZOLE 500 MG PO TABS
500.0000 mg | ORAL_TABLET | Freq: Two times a day (BID) | ORAL | 0 refills | Status: DC
Start: 2023-07-06 — End: 2023-12-21

## 2023-07-06 MED ORDER — NYSTATIN 100000 UNIT/GM EX OINT: 1.0000 | TOPICAL_OINTMENT | Freq: Two times a day (BID) | CUTANEOUS | 1 refills | Status: AC

## 2023-07-06 NOTE — Progress Notes (Signed)
 Meredith Gilbert 10-21-1955 [id number]   History:  68 y.o. [id number] presents for breast and pelvic exam. Complains of intermittent groin itching/redness. Has tried OTC yeast creams with some improvement. Postmenopausal - no HRT. S/P 2016 LAVH BSO for AUB/complex endometrial hyperplasia with benign pathology. Normal pap history. HTN managed by PCP.   Gynecologic History Patient's last menstrual period was 02/25/2014 (approximate).   Contraception/Family planning: status post hysterectomy Sexually active: Yes  Health Maintenance Last Pap: 05/07/2021. Results were: Normal, 3-year repeat Last mammogram: 06/25/2023. Results were: Normal Last colonoscopy: 10/26/2010. Results were: Normal. Negative Cologuard 04/2021 Last Dexa: 04/16/2023. Results were: Normal  Past medical history, past surgical history, family history and social history were all reviewed and documented in the EPIC chart. Married. 2 daughters in [place], son in [place]. 3 grandchildren ages 19-28. Retired.  ROS:  A ROS was performed and pertinent positives and negatives are included.  Exam:  Vitals:   07/06/23 0950  BP: 118/64  Pulse: 66  SpO2: 97%  Weight: 210 lb (95.3 kg)  Height: 5\' 3"  (1.6 m)     Body mass index is 37.2 kg/m.  General appearance:  Normal Thyroid:  Symmetrical, normal in size, without palpable masses or nodularity. Respiratory  Auscultation:  Clear without wheezing or rhonchi Cardiovascular  Auscultation:  Regular rate, without rubs, murmurs or gallops  Edema/varicosities:  Not grossly evident Abdominal  Soft,nontender, without masses, guarding or rebound.  Liver/spleen:  No organomegaly noted  Hernia:  None appreciated  Skin  Inspection:  Grossly normal Breasts: Examined lying and sitting.   Right: Without masses, retractions, nipple discharge or axillary adenopathy.   Left: Without masses, retractions, nipple discharge or axillary adenopathy. Pelvic: External genitalia:  no lesions,  mild redness in groin              Urethra:  normal appearing urethra with no masses, tenderness or lesions              Bartholins and Skenes: normal                 Vagina: + odor, discharge, no erythema              Cervix: absent Bimanual Exam:  Uterus:  absent              Adnexa: no mass, fullness, tenderness              Rectovaginal: Deferred              Anus:  normal, no lesions  Patient informed chaperone available to be present for breast and pelvic exam. Patient has requested no chaperone to be present. Patient has been advised what will be completed during breast and pelvic exam.   Wet prep + clue cells (+ odor)  Assessment/Plan:  68 y.o. [id number] for abreast and pelvic exam.   Encounter for breast and pelvic examination - Education provided on SBEs, importance of preventative screenings, current guidelines, high calcium diet, regular exercise, and multivitamin daily.  Labs with PCP.  Skin yeast infection - Plan: nystatin ointment (MYCOSTATIN) BID x 7-10 days to groin.   Vaginal odor - Plan: WET PREP FOR TRICH, YEAST, CLUE. + clue cells.   Bacterial vaginosis - Plan: metroNIDAZOLE (FLAGYL) 500 MG tablet BID x 7 days.   Postmenopausal - No HRT. S/P LAVH  BSO for AUB/complex endometrial hyperplasia, benign pathology.  Screening for colon cancer - Negative Cologuard 04/2021. Will repeat at 3-year interval per recommendation.   Screening for  cervical cancer - Normal Pap history. Discussed current guidelines and option to stop screenings. She would like to continue. Will repeat at 3-year interval per guidelines.   Screening for breast cancer - Normal mammogram history.  Continue annual screenings.  Normal breast exam today.  Screening for osteoporosis - Normal bone density December 2024.  Return in about 2 years (around 07/05/2025) for B&P.    Olivia Mackie DNP, 10:37 AM 07/06/2023

## 2023-11-19 ENCOUNTER — Emergency Department (HOSPITAL_BASED_OUTPATIENT_CLINIC_OR_DEPARTMENT_OTHER)

## 2023-11-19 ENCOUNTER — Encounter (HOSPITAL_BASED_OUTPATIENT_CLINIC_OR_DEPARTMENT_OTHER): Payer: Self-pay | Admitting: Emergency Medicine

## 2023-11-19 ENCOUNTER — Other Ambulatory Visit (HOSPITAL_BASED_OUTPATIENT_CLINIC_OR_DEPARTMENT_OTHER): Payer: Self-pay

## 2023-11-19 ENCOUNTER — Other Ambulatory Visit: Payer: Self-pay

## 2023-11-19 ENCOUNTER — Emergency Department (HOSPITAL_BASED_OUTPATIENT_CLINIC_OR_DEPARTMENT_OTHER): Admission: EM | Admit: 2023-11-19 | Discharge: 2023-11-19 | Disposition: A | Discharge status: Home / Self Care

## 2023-11-19 DIAGNOSIS — R0789 Other chest pain: Secondary | ICD-10-CM | POA: Diagnosis present

## 2023-11-19 DIAGNOSIS — I10 Essential (primary) hypertension: Secondary | ICD-10-CM | POA: Diagnosis not present

## 2023-11-19 DIAGNOSIS — Z720 Tobacco use: Secondary | ICD-10-CM | POA: Insufficient documentation

## 2023-11-19 DIAGNOSIS — R600 Localized edema: Secondary | ICD-10-CM | POA: Insufficient documentation

## 2023-11-19 DIAGNOSIS — I480 Paroxysmal atrial fibrillation: Secondary | ICD-10-CM | POA: Insufficient documentation

## 2023-11-19 DIAGNOSIS — Z79899 Other long term (current) drug therapy: Secondary | ICD-10-CM | POA: Insufficient documentation

## 2023-11-19 LAB — BASIC METABOLIC PANEL WITH GFR
Anion gap: 17 — ABNORMAL HIGH (ref 5–15)
BUN: 17 mg/dL (ref 8–23)
CO2: 19 mmol/L — ABNORMAL LOW (ref 22–32)
Calcium: 9.5 mg/dL (ref 8.9–10.3)
Chloride: 104 mmol/L (ref 98–111)
Creatinine, Ser: 0.87 mg/dL (ref 0.44–1.00)
GFR, Estimated: >60 mL/min (ref >60)
Glucose, Bld: 155 mg/dL — ABNORMAL HIGH (ref 70–99)
Potassium: 3.6 mmol/L (ref 3.5–5.1)
Sodium: 140 mmol/L (ref 135–145)

## 2023-11-19 LAB — CBC
HCT: 45.4 % (ref 36.0–46.0)
Hemoglobin: 15.8 g/dL — ABNORMAL HIGH (ref 12.0–15.0)
MCH: 30.2 pg (ref 26.0–34.0)
MCHC: 34.8 g/dL (ref 30.0–36.0)
MCV: 86.6 fL (ref 80.0–100.0)
Platelets: 365 K/uL (ref 150–400)
RBC: 5.24 MIL/uL — ABNORMAL HIGH (ref 3.87–5.11)
RDW: 12.6 % (ref 11.5–15.5)
WBC: 10.5 K/uL (ref 4.0–10.5)
nRBC: 0 % (ref 0.0–0.2)

## 2023-11-19 LAB — TROPONIN T, HIGH SENSITIVITY: Troponin T High Sensitivity: <15 ng/L (ref <19)

## 2023-11-19 LAB — TSH: TSH: 0.387 u[IU]/mL (ref 0.350–4.500)

## 2023-11-19 LAB — MAGNESIUM: Magnesium: 2.2 mg/dL (ref 1.7–2.4)

## 2023-11-19 MED ORDER — DILTIAZEM HCL 25 MG/5ML IV SOLN: 20.0000 mg | Freq: Once | INTRAVENOUS | Status: DC

## 2023-11-19 MED ORDER — RIVAROXABAN 20 MG PO TABS
20.0000 mg | ORAL_TABLET | Freq: Once | ORAL | Status: AC
  Administered 2023-11-19: 20 mg via ORAL
  Filled 2023-11-19: qty 1

## 2023-11-19 MED ORDER — RIVAROXABAN (XARELTO) EDUCATION KIT FOR AFIB PATIENTS
1.0000 | PACK | Freq: Once | Status: AC
  Administered 2023-11-19: 1
  Filled 2023-11-19: qty 1

## 2023-11-19 MED ORDER — SODIUM CHLORIDE 0.9 % IV BOLUS
1000.0000 mL | Freq: Once | INTRAVENOUS | Status: AC
  Administered 2023-11-19: 1000 mL via INTRAVENOUS

## 2023-11-19 MED ORDER — RIVAROXABAN 20 MG PO TABS
20.0000 mg | ORAL_TABLET | Freq: Every day | ORAL | 0 refills | Status: AC
  Filled 2023-11-19: qty 30, 30d supply, fill #0

## 2023-11-19 NOTE — ED Notes (Signed)
 Called lab for delay BMP, they're processing it now, issued on back end.

## 2023-11-19 NOTE — ED Notes (Signed)
 Patient transported to X-ray, with medic on cardiac monitor

## 2023-11-19 NOTE — Discharge Instructions (Signed)
 Follow up with the Afib clinic ( you will be contacted) Take your xarelto nightly WITH FOOD. Contact a health care provider if: You notice a change in the rate, rhythm, or strength of your heartbeat. You are taking a blood thinner and you notice more bruising. You tire more easily when you exercise or do heavy work. You have a sudden change in weight. Get help right away if:  You have chest pain. You have trouble breathing. You have side effects of blood thinners, such as blood in your vomit, poop (stool), or pee (urine), or bleeding that does not stop. You have any symptoms of a stroke. BE FAST is an easy way to remember the main warning signs of a stroke: B - Balance. Signs are dizziness, sudden trouble walking, or loss of balance. E - Eyes. Signs are trouble seeing or a sudden change in vision. F - Face. Signs are sudden weakness or numbness of the face, or the face or eyelid drooping on one side. A - Arms. Signs are weakness or numbness in an arm. This happens suddenly and usually on one side of the body. S - Speech.Signs are sudden trouble speaking, slurred speech, or trouble understanding what people say. T - Time. Time to call emergency services. Write down what time symptoms started. Other signs of a stroke, such as: A sudden, severe headache with no known cause. Nausea or vomiting. Seizure. These symptoms may be an emergency. Get help right away. Call 911. Do not wait to see if the symptoms will go away. Do not drive yourself to the hospital. This information is not intended to replace advice given to you by your health care provider. Make sure you discuss any questions you have with your health care provider.

## 2023-11-19 NOTE — ED Provider Notes (Signed)
 [facility] Provider Note   CSN: [id number] Arrival date & time: 11/19/23  1019     Patient presents with: Chest Pain   Meredith Gilbert is a 68 y.o. female with a past medical history of abnormal thyroid  test, back pain, hypertension, family history of coronary artery disease who presents emergency department with a chief complaint of racing heart palpitations.  Patient states that she was on the phone with her mother last evening afterwards she noticed some discomfort in her chest.  She she reports having similar but brief episodes of the same feeling in her chest in the past.  She states that she had difficulty sleeping all night and when she woke up in the morning after brief sleep noticed that she still had the discomfort in her chest so came for evaluation.  She has a recent history of URI symptoms.  She has noted some swelling in her left leg that has been intermittent.  She denies chest pain, shortness of breath, near-syncope, urinary symptoms. Patient did recently have a Girls day with increased ETOH intake.    Chest Pain      Prior to Admission medications   Medication Sig Start Date End Date Taking? Authorizing Provider  Acetaminophen  (TYLENOL  PO) Take 2 tablets by mouth daily as needed (pain).    [provider]  chlorhexidine  (PERIDEX) 0.12 % solution as needed.  08/13/14   [provider]  Cholecalciferol (VITAMIN D3 PO) Take 5,000 Int'l Units by mouth.    [provider]  Cyanocobalamin (B-12 PO) Take by mouth.    [provider]  losartan -hydrochlorothiazide  (HYZAAR) 100-12.5 MG tablet Take by mouth. 02/15/19   [provider]  metroNIDAZOLE  (FLAGYL ) 500 MG tablet Take 1 tablet (500 mg total) by mouth 2 (two) times daily. 07/06/23   Prentiss Annabella LABOR, NP  Multiple Vitamins-Minerals (ZINC PO) Take 100 mg by mouth.    [provider]  nystatin  ointment (MYCOSTATIN ) Apply 1  Application topically 2 (two) times daily. 07/06/23   Prentiss Annabella A, NP  Olopatadine HCl 0.2 % SOLN 1 drop daily.    [provider]  Propylene Glycol (SYSTANE BALANCE) 0.6 % SOLN Apply to eye. 09/07/16   [provider]  tiZANidine (ZANAFLEX) 4 MG tablet Take 4 mg by mouth 3 (three) times daily. 02/20/22   [provider]    Allergies: Atorvastatin, Ibuprofen, Lisinopril, and Oseltamivir    Review of Systems  Cardiovascular:  Positive for chest pain.    Updated Vital Signs BP (!) 174/95   Pulse 75   Temp 97.6 F (36.4 C) (Oral)   Resp (!) 22   LMP 02/25/2014 (Approximate)   SpO2 97%   Physical Exam Vitals and nursing note reviewed.  Constitutional:      General: She is not in acute distress.    Appearance: She is well-developed. She is not diaphoretic.  HENT:     Head: Normocephalic and atraumatic.     Right Ear: External ear normal.     Left Ear: External ear normal.     Nose: Nose normal.     Mouth/Throat:     Mouth: Mucous membranes are moist.  Eyes:     General: No scleral icterus.    Conjunctiva/sclera: Conjunctivae normal.  Cardiovascular:     Rate and Rhythm: Tachycardia present. Rhythm irregularly irregular.     Heart sounds: Normal heart sounds. No murmur heard.    No friction rub. No gallop.  Pulmonary:     Effort: Pulmonary effort is normal. No tachypnea or respiratory distress.     Breath sounds: Normal breath sounds.  Abdominal:     General: Bowel sounds are normal. There is no distension.     Palpations: Abdomen is soft. There is no mass.     Tenderness: There is no abdominal tenderness. There is no guarding.  Musculoskeletal:     Cervical back: Normal range of motion.     Right lower leg: No edema.     Left lower leg: 1+ Edema present.  Skin:    General: Skin is warm and dry.  Neurological:     Mental Status: She is alert and oriented to person, place, and time.  Psychiatric:        Behavior: Behavior normal.      (all labs ordered are listed, but only abnormal results are displayed) Labs Reviewed  MAGNESIUM  BASIC METABOLIC PANEL WITH GFR  CBC  TSH  TROPONIN T, HIGH SENSITIVITY    EKG: EKG Interpretation Date/Time:  Friday November 19 2023 10:25:37 EDT Ventricular Rate:  163 PR Interval:    QRS Duration:  86 QT Interval:  283 QTC Calculation: 466 R Axis:   59  Text Interpretation: Atrial fibrillation with rapid V-rate ST depression, probably rate related Confirmed by Neysa Clap [phone]) on 11/19/2023 10:33:32 AM  Radiology: DG Chest 2 View Result Date: 11/19/2023 CLINICAL DATA:  chest pain EXAM: CHEST - 2 VIEW COMPARISON:  August 17, 2017 FINDINGS: No focal airspace consolidation, pleural effusion, or pneumothorax. No cardiomegaly. No acute fracture or destructive lesion. Multilevel thoracic osteophytosis. IMPRESSION: No acute cardiopulmonary abnormality. Electronically Signed   By: Rogelia Myers M.D.   On: 11/19/2023 11:03     Procedures   Medications Ordered in the ED  sodium chloride 0.9 % bolus 1,000 mL (has no administration in time range)  diltiazem (CARDIZEM) injection 20 mg (has no administration in time range)    Clinical Course as of 11/19/23 1120  Fri Nov 19, 2023  1119 Patient spontaneously converted to normal sinus rhythm- repeat EKG shows NSR at a rate of 73 [AH]    Clinical Course User Index [AH] Arloa Chroman, PA-C         CHA2DS2-VASc Score: 3                        Medical Decision Making Amount and/or Complexity of Data Reviewed Labs: ordered. Radiology: ordered.  Risk Prescription drug management.   This patient presents to the ED for concern of palpitations, this involves an extensive number of treatment options, and is a complaint that carries with it a high risk of complications and morbidity.  The differential diagnosis for palpitations includes cardiac arrhythmias, PVC/PAC, ACS, Cardiomyopathy, CHF, MVP, pericarditis, valvular disease,  Panic/Anxiety, Somatic disorder, ETOH, Caffeine,  Stimulant use, medication side effect, Anemia, Hyperthyroidism, pulmonary embolism.;  Co morbidities:  HTN, abnl thyroid  test  Social Determinants of Health:   SDOH Screenings   Tobacco Use: Low Risk  (11/19/2023)       Lab Tests:  I Ordered, and personally interpreted labs.  The pertinent results include:   CBG shows glucose of 155 mildly elevated, anion gap of 17 consider some dehydration.  Hemoglobin 15.8 also suggestive of volume contraction Imaging Studies:  I ordered imaging studies including cxr, DVT study left leg I independently visualized and interpreted imaging which showed no acute findings I agree with the radiologist interpretation  Cardiac Monitoring/ECG:  The patient was maintained on a cardiac monitor.  I personally viewed and interpreted the cardiac monitored which showed an underlying rhythm of:  Initial EKG shows atrial fibrillation at a rate of 163  Medicines ordered and prescription drug management:  I ordered medication including  Medications  Rivaroxaban (XARELTO) tablet 15 mg (has no administration in time range)  rivaroxaban (XARELTO) Education Kit for Afib patients 1 kit (has no administration in time range)  sodium chloride 0.9 % bolus 1,000 mL (1,000 mLs Intravenous New Bag/Given 11/19/23 1118)   for self converted A-fib with RVR with heart score of 3 Reevaluation of the patient after these medicines showed that the patient improved I have reviewed the patients home medicines and have made adjustments as needed  Test Considered:   I considered a CT angiogram to rule out pulmonary embolus however DVT study is negative patient is not having chest pain shortness of breath feelings of syncope and I have low suspicion that this is the underlying cause of her A-fib  Critical Interventions:   fluid  Consultations Obtained:   Problem List / ED Course:     ICD-10-CM   1. Paroxysmal atrial  fibrillation (HCC)  I48.0       MDM: Patient here with paroxysmal atrial fibrillation which spontaneously converted in the emergency department with fluid administration.  No rate control medications given.  CHA2DS2-VASc score of 3.  Patient started on Xarelto 20 mg daily.  Referral to the A-fib clinic.  Anticoagulation precautions lifestyle modifications and reasons to seek immediate medical care precautions given.   Dispostion:  After consideration of the diagnostic results and the patients response to treatment, I feel that the patent would benefit from discharge with close outpatient follow-up.      Final diagnoses:  None    ED Discharge Orders          Ordered    Amb referral to AFIB Clinic        11/19/23 1046               Arloa Chroman, PA-C 11/19/23 1314    Neysa Caron PARAS, [place] 11/19/23 1540

## 2023-11-19 NOTE — ED Triage Notes (Signed)
 Pt reports chest tightness that developed last night with palpitations at rest. HR 160 in triage   Denies SOB

## 2023-11-19 NOTE — ED Notes (Signed)

## 2023-11-19 NOTE — ED Notes (Signed)
 When EDPA was talking to the patient, she converted without conflict. The pt advised the palpitations have resoled and she no longer has pain. No neurological deficits noted. Pt able to speak in complete sentences and in no acute distress. 12L reobtained per EDPA order and 3lead images uploaded of her self-conversion.

## 2023-11-19 NOTE — ED Notes (Signed)
 Pharmacy is waiting to confirm correct dosing. Pt comfortable at this time.

## 2023-12-16 NOTE — Progress Notes (Signed)
 [facility]  - PRIMARY C[facility] Medicare Wellness Visit Type:: Initial Annual Wellness Visit  Name: Meredith Gilbert Date of Birth: 1955/05/26 Age: 68 y.o. 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 Does the patient have a support system (family, friend, church, Conservation officer, nature, etc)?: Yes   Do you have any dental concerns?: (!) Yes In the past month, have you experienced a change in your bladder control?: No   Do you have any difficulty obtaining your medications?: No   Do you have trouble consistently taking or remembering to take all of your medications as prescribed?: No Patient rates overall stress level as: None Does stress affect daily life?: No Typical amount of pain: some Does pain affect daily life?: No Are you currently prescribed opioids?: No                Depression Screening      Social History (Tobacco/Drugs/Sexual Activity) Chakira reports that she has never smoked. She has never been exposed to tobacco smoke. She has never used smokeless tobacco. Tobacco Use?: No How many times in the past year have you used a recreational drug or used a prescription medication for nonmedical reasons?: None Risk factors for sexually transmitted infections (i.e., multiple sexual partners): No Are you bothered by sexual problems?: No  Alcohol Screening How often do you have a drink containing alcohol?: Never How many standard drinks containing alcohol do you have on a typical day?: Never, 1 or 2 drinks How often do you have six or more drinks on one occasion?: Never Audit-C Score: 0  Physical Activity Regular exercise?: (!) No      Diet How many meals a day?: 3 Eats fruit and vegetables daily?:  Yes Most meals are obtained by: preparing their own meals, eating out  Home and Transportation Safety All rugs have non-skid backing?: Yes All stairs or steps have railings?: Yes Grab bars in the bathtub or shower?: (!) No Have non-skid surface in bathtub or shower?: Yes Good home lighting?: Yes Regular seat belt use?: Yes      Activities of Daily Living Feed self?: Yes Bathe self?: Yes Dress self?: Yes Use toilet without assistance?: Yes Walk without assistance?: Yes    Instrumental Activities of Daily Living Manage finances?: Yes Shop for themselves?: Yes Prepare meals?: Yes Use the telephone?: Yes Manage medications?: Yes   Performs basic housework/laundry?: Yes Drives?: Yes Primary transportation is: driving  Hearing Concerns about hearing?: No Uses hearing aids?: No Hear whispered voice? (Observed): Yes  Vision Concerns about vision?: No Vision exam performed?: Yes  Fall Risk Is the patient ambulatory?: Yes One or more falls in the last year:: No Feels unsteady when walking:: Yes  Cognitive Assessment Has a diagnosis of dementia or cognitive impairment?: No Are there any memory concerns by the patient, others, or providers?: No              Advance Directives Living will?: Yes Advance directive information provided to patient: No Healthcare POA?: Yes Name of Health Care Agent: John Hinze Relationship to patient: Spouse           Other History I reviewed and updated the following risk factors and conditions as appropriate: Reviewed/Updated: Problem List, Medical History, Surgical History, Family History, Medications, Allergies Reviewed/Updated: Vital Signs (height, weight, and BP), Immunizations, Health Maintenance Patient Care Team Updated:  Done  Vital Signs BP 125/68 (BP Location: Right arm, Patient Position: Sitting)   Pulse 72   Ht 1.6 m (5' 3)   Wt 96.2 kg (212 lb)   SpO2 98%   BMI 37.55 kg/m   Screening and  Immunizations Health Maintenance Status      Date Due Completion Dates   Bone Density Scan Never done ---   Hepatitis C Screening Never done ---   Pneumococcal Vaccine for Ages 50+ (1 of 1 - PCV) Never done ---   COVID-19 Vaccine (4 - 2024-25 season) 12/27/2022 04/11/2020, 09/13/2019   Influenza Vaccine (1) 11/26/2023 02/12/2023, 01/28/2022   Colorectal Cancer Screening 05/16/2024 05/16/2021, 05/16/2021   Comment on 05/21/2010: See [facility] System   Depression Screening 12/05/2024 12/06/2023   Diabetes Screening 12/07/2024 12/08/2023, 09/11/2023   Comprehensive Annual Visit 12/15/2024 12/16/2023, 11/17/2022   Medicare Annual Wellness (AWV) Subsequent Visits 12/15/2024 12/16/2023   Breast Cancer Screening (Mammogram) 06/24/2025 06/25/2023, 06/01/2022   DTaP/Tdap/Td Vaccines (3 - Td or Tdap) 03/26/2027 03/25/2017, 05/13/2007   Adult RSV (60+ Years or Pregnancy) (1 - 1-dose 75+ series) 10/22/2032 ---       Immunization History  Administered Date(s) Administered  . Influenza Split 02/08/2014  . Influenza, Injectable, Quadrivalent, Preservative Free 02/02/2019  . Influenza, Injectable, Quadrivalent, Preservative free, Pediatric 02/21/2016  . Influenza, Unspecified 02/08/2014, 02/21/2016, 02/02/2019, 01/28/2022  . Influenza, high-dose, trivalent, PF 02/12/2023  . Influenza, split virus, trivalent, preservative 02/08/2014, 02/17/2021  . Moderna SARS-CoV-2 Primary Series 12+ yrs 08/14/2019, 09/13/2019, 04/11/2020  . TDAP VACCINE (BOOSTRIX,ADACEL) 7Y+ 05/13/2007, 03/25/2017  . Varicella Zoster [facility]) 18Y+ 11/17/2022, 02/12/2023    Assessment/Plan: Initial Annual Wellness Visit: The topics above were reviewed with the patient.  Healthy lifestyle principles reviewed.  Recommendations provided when indicated.  Follow up 1 year for next wellness visit.  No orders of the defined types were placed in this encounter.   New Medications Ordered This Visit  Medications  . triamcinolone acetonide (KENALOG)  0.1 % ointment  . losartan -hydroCHLOROthiazide  (HYZAAR) 100-12.5 mg per tablet    Sig: Take 1 tablet by mouth daily.    Dispense:  100 tablet    Refill:  3    Patient Care Team: Daron Annitta Mace, NP as PCP - General (Nurse Practitioner) Daron Annitta Mace, NP as PCP - Attributed  Electronically signed by: Alm CHRISTELLA Formica, MD 12/16/2023 3:37 PM

## 2023-12-21 ENCOUNTER — Ambulatory Visit (HOSPITAL_COMMUNITY)
Admission: RE | Admit: 2023-12-21 | Discharge: 2023-12-21 | Disposition: A | Discharge status: Home / Self Care | Source: Ambulatory Visit | Attending: Internal Medicine | Admitting: Internal Medicine

## 2023-12-21 ENCOUNTER — Encounter (HOSPITAL_COMMUNITY): Payer: Self-pay | Admitting: Internal Medicine

## 2023-12-21 VITALS — BP 146/76 | HR 75 | Ht 63.0 in | Wt 210.8 lb

## 2023-12-21 DIAGNOSIS — D6869 Other thrombophilia: Secondary | ICD-10-CM | POA: Diagnosis not present

## 2023-12-21 DIAGNOSIS — I4891 Unspecified atrial fibrillation: Secondary | ICD-10-CM

## 2023-12-21 DIAGNOSIS — I480 Paroxysmal atrial fibrillation: Secondary | ICD-10-CM | POA: Diagnosis not present

## 2023-12-21 DIAGNOSIS — I4819 Other persistent atrial fibrillation: Secondary | ICD-10-CM

## 2023-12-21 NOTE — Patient Instructions (Addendum)
 Echocardiogram -- scheduling will call once insurance authorization received.   BLOOD THINNERS -Pradaxa or warfarin   GoodRx

## 2023-12-21 NOTE — Progress Notes (Signed)
 Primary Care Physician: Brookes, Meredith Jacinda, FNP Primary Cardiologist: None Electrophysiologist: None     Referring Physician: ED     Meredith Gilbert is a 68 y.o. female with a history of HTN, abnormal thyroid  test, family history of CAD, and atrial fibrillation who presents for consultation in the [facility].  ED visit on 11/19/23 for heart palpitations found to be in new Afib with RVR. Patient is on Xarelto  20 mg daily for a CHADS2VASC score of 3.  On evaluation today, patient is currently in NSR. Patient has stopped OAC per PCP due to elevated LFTs. She is going to recheck LFTs per PCP. She has been wearing CPAP for sleep apnea since 2008. She notes a strong family history of CAD and stroke in family.   Today, she denies symptoms of shortness of breath, orthopnea, PND, lower extremity edema, dizziness, presyncope, syncope, snoring, daytime somnolence, bleeding, or neurologic sequela. The patient is tolerating medications without difficulties and is otherwise without complaint today.    Atrial Fibrillation Risk Factors:  she does have symptoms or diagnosis of sleep apnea. she is compliant with CPAP therapy.   she has a BMI of Body mass index is 37.34 kg/m.SABRA Filed Weights   12/21/23 0938  Weight: 95.6 kg    Current Outpatient Medications  Medication Sig Dispense Refill   acetaminophen  (TYLENOL ) 650 MG CR tablet Take 650 mg by mouth as needed for pain.     chlorhexidine  (PERIDEX) 0.12 % solution Use as directed 15 mLs in the mouth or throat as needed (pain).  0   Cholecalciferol (VITAMIN D3) 1.25 MG (50000 UT) CAPS Take 5,000 Units by mouth daily.     ciclopirox (LOPROX) 0.77 % SUSP Apply 1 Application topically daily.     cyanocobalamin (VITAMIN B12) 1000 MCG tablet Take 1,000 mcg by mouth daily.     fluticasone (FLONASE) 50 MCG/ACT nasal spray Place 1 spray into both nostrils at bedtime.     losartan -hydrochlorothiazide  (HYZAAR) 100-12.5  MG tablet Take 1 tablet by mouth at bedtime.     nystatin  ointment (MYCOSTATIN ) Apply 1 Application topically 2 (two) times daily. 30 g 1   Olopatadine HCl 0.2 % SOLN Place 1 drop into both eyes at bedtime.     Propylene Glycol (SYSTANE BALANCE) 0.6 % SOLN Place 1 drop into both eyes as needed (dry eyes).     tiZANidine (ZANAFLEX) 4 MG tablet Take 1 mg by mouth as needed for muscle spasms.     Zinc 50 MG TABS Take 50 mg by mouth daily.     rivaroxaban  (XARELTO ) 20 MG TABS tablet Take 1 tablet (20 mg total) by mouth daily with supper. (Patient not taking: Reported on 12/21/2023) 30 tablet 0   No current facility-administered medications for this encounter.    Atrial Fibrillation Management history:  Previous antiarrhythmic drugs: none Previous cardioversions: none Previous ablations: none Anticoagulation history: Xarelto    ROS- All systems are reviewed and negative except as per the HPI above.  Physical Exam: BP (!) 146/76   Pulse 75   Ht 5' 3 (1.6 m)   Wt 95.6 kg   LMP 02/25/2014 (Approximate)   BMI 37.34 kg/m   GEN: Well nourished, well developed in no acute distress NECK: No JVD; No carotid bruits CARDIAC: Regular rate and rhythm, no murmurs, rubs, gallops RESPIRATORY:  Clear to auscultation without rales, wheezing or rhonchi  ABDOMEN: Soft, non-tender, non-distended EXTREMITIES:  No edema; No deformity   EKG today demonstrates  Vent. rate 75 BPM PR interval 158 ms QRS duration 82 ms QT/QTcB 398/444 ms P-R-T axes 36 31 41 Sinus rhythm with Premature atrial complexes Otherwise normal ECG When compared with ECG of 19-Nov-2023 11:10, PREVIOUS ECG IS PRESENT  Echo N/A  ASSESSMENT & PLAN CHA2DS2-VASc Score = 3  The patient's score is based upon: CHF History: 0 HTN History: 1 Diabetes History: 0 Stroke History: 0 Vascular Disease History: 0 Age Score: 1 Gender Score: 1       ASSESSMENT AND PLAN: Paroxysmal Atrial Fibrillation (ICD10:  I48.0) The patient's  CHA2DS2-VASc score is 3, indicating a 3.2% annual risk of stroke.    Patient is currently in NSR. Education provided about Afib. Discussion about triggers for Afib and rhythm monitoring device recommended. Will order baseline echocardiogram. Will refer to establish with general cardiologist due to strong family history of CAD.   Secondary Hypercoagulable State (ICD10:  D68.69) The patient is at significant risk for stroke/thromboembolism based upon her CHA2DS2-VASc Score of 3.  Continue Rivaroxaban  (Xarelto ).  We discussed the reasoning behind anticoagulation in the setting of stroke prevention related to Afib. We discussed the benefits vs risks of anticoagulation. I advised patient the recommendation to continue OAC even with mild LFT elevation due to risk of stroke. She will likely still hold per PCP instruction and resume when advised by PCP. Patient noted cost of medication is too high and advised to contact insurance for pricing /payment plan on Eliquis and Pradaxa. I also mentioned coumadin as an option.     Refer to establish with general cardiology. Follow up Afib clinic prn.   [name], PA-C  [facility] [address] [place], [place] [address] [phone]

## 2023-12-23 ENCOUNTER — Other Ambulatory Visit (HOSPITAL_COMMUNITY): Payer: Self-pay

## 2023-12-23 DIAGNOSIS — I4891 Unspecified atrial fibrillation: Secondary | ICD-10-CM

## 2023-12-29 ENCOUNTER — Ambulatory Visit (HOSPITAL_COMMUNITY)
Admission: RE | Admit: 2023-12-29 | Discharge: 2023-12-29 | Disposition: A | Discharge status: Home / Self Care | Source: Ambulatory Visit | Attending: Vascular Surgery | Admitting: Vascular Surgery

## 2023-12-29 DIAGNOSIS — I4891 Unspecified atrial fibrillation: Secondary | ICD-10-CM | POA: Diagnosis present

## 2023-12-29 LAB — ECHOCARDIOGRAM COMPLETE
AR max vel: 1.25 cm2
AV Area VTI: 1.2 cm2
AV Area mean vel: 1.27 cm2
AV Mean grad: 5 mmHg
AV Peak grad: 9.9 mmHg
Ao pk vel: 1.57 m/s
Area-P 1/2: 3.6 cm2
S' Lateral: 2.51 cm

## 2023-12-30 ENCOUNTER — Ambulatory Visit (HOSPITAL_COMMUNITY): Payer: Self-pay | Admitting: Internal Medicine
